# Patient Record
Sex: Male | Born: 1945 | Race: White | Hispanic: No | Marital: Married
Health system: Southern US, Community
[De-identification: ages and names within clinical notes are randomized; demographics above are authoritative.]

## PROBLEM LIST (undated history)

## (undated) ENCOUNTER — Ambulatory Visit: Admission: EM | Payer: No Typology Code available for payment source

## (undated) DIAGNOSIS — M1990 Unspecified osteoarthritis, unspecified site: Secondary | ICD-10-CM

## (undated) DIAGNOSIS — K219 Gastro-esophageal reflux disease without esophagitis: Secondary | ICD-10-CM

## (undated) DIAGNOSIS — K589 Irritable bowel syndrome without diarrhea: Secondary | ICD-10-CM

## (undated) DIAGNOSIS — J189 Pneumonia, unspecified organism: Secondary | ICD-10-CM

## (undated) DIAGNOSIS — Z91018 Allergy to other foods: Secondary | ICD-10-CM

## (undated) DIAGNOSIS — K635 Polyp of colon: Secondary | ICD-10-CM

## (undated) DIAGNOSIS — J40 Bronchitis, not specified as acute or chronic: Secondary | ICD-10-CM

## (undated) DIAGNOSIS — K449 Diaphragmatic hernia without obstruction or gangrene: Secondary | ICD-10-CM

## (undated) DIAGNOSIS — K5792 Diverticulitis of intestine, part unspecified, without perforation or abscess without bleeding: Secondary | ICD-10-CM

## (undated) DIAGNOSIS — M539 Dorsopathy, unspecified: Secondary | ICD-10-CM

## (undated) DIAGNOSIS — K5790 Diverticulosis of intestine, part unspecified, without perforation or abscess without bleeding: Secondary | ICD-10-CM

## (undated) DIAGNOSIS — K289 Gastrojejunal ulcer, unspecified as acute or chronic, without hemorrhage or perforation: Secondary | ICD-10-CM

## (undated) DIAGNOSIS — J349 Unspecified disorder of nose and nasal sinuses: Secondary | ICD-10-CM

## (undated) DIAGNOSIS — N200 Calculus of kidney: Secondary | ICD-10-CM

## (undated) DIAGNOSIS — K649 Unspecified hemorrhoids: Secondary | ICD-10-CM

## (undated) HISTORY — DX: Diaphragmatic hernia without obstruction or gangrene: K44.9

## (undated) HISTORY — DX: Dorsopathy, unspecified: M53.9

## (undated) HISTORY — DX: Calculus of kidney: N20.0

## (undated) HISTORY — DX: Diverticulitis of intestine, part unspecified, without perforation or abscess without bleeding: K57.92

## (undated) HISTORY — DX: Unspecified disorder of nose and nasal sinuses: J34.9

## (undated) HISTORY — DX: Bronchitis, not specified as acute or chronic: J40

## (undated) HISTORY — DX: Unspecified hemorrhoids: K64.9

## (undated) HISTORY — DX: Gastro-esophageal reflux disease without esophagitis: K21.9

## (undated) HISTORY — DX: Pneumonia, unspecified organism: J18.9

## (undated) HISTORY — DX: Unspecified osteoarthritis, unspecified site: M19.90

## (undated) HISTORY — DX: Irritable bowel syndrome, unspecified: K58.9

## (undated) HISTORY — DX: Gastrojejunal ulcer, unspecified as acute or chronic, without hemorrhage or perforation: K28.9

## (undated) HISTORY — PX: ESOPHAGEAL DILATION: SHX303

## (undated) HISTORY — DX: Diverticulosis of intestine, part unspecified, without perforation or abscess without bleeding: K57.90

## (undated) HISTORY — DX: Polyp of colon: K63.5

---

## 2003-11-12 DIAGNOSIS — K289 Gastrojejunal ulcer, unspecified as acute or chronic, without hemorrhage or perforation: Secondary | ICD-10-CM

## 2003-11-12 HISTORY — DX: Gastrojejunal ulcer, unspecified as acute or chronic, without hemorrhage or perforation: K28.9

## 2004-11-11 HISTORY — PX: HERNIA REPAIR: SHX51

## 2009-01-02 ENCOUNTER — Emergency Department: Payer: Self-pay | Admitting: Unknown Physician Specialty

## 2010-09-04 ENCOUNTER — Emergency Department: Payer: Self-pay | Admitting: Unknown Physician Specialty

## 2011-11-12 HISTORY — PX: HERNIA REPAIR: SHX51

## 2012-06-29 ENCOUNTER — Ambulatory Visit: Payer: Self-pay

## 2012-07-21 ENCOUNTER — Ambulatory Visit: Payer: Self-pay | Admitting: Ophthalmology

## 2012-07-21 DIAGNOSIS — I10 Essential (primary) hypertension: Secondary | ICD-10-CM

## 2012-08-03 ENCOUNTER — Ambulatory Visit: Payer: Self-pay | Admitting: Ophthalmology

## 2012-11-11 DIAGNOSIS — K635 Polyp of colon: Secondary | ICD-10-CM

## 2012-11-11 DIAGNOSIS — K5792 Diverticulitis of intestine, part unspecified, without perforation or abscess without bleeding: Secondary | ICD-10-CM

## 2012-11-11 HISTORY — PX: COLONOSCOPY: SHX174

## 2012-11-11 HISTORY — DX: Diverticulitis of intestine, part unspecified, without perforation or abscess without bleeding: K57.92

## 2012-11-11 HISTORY — DX: Polyp of colon: K63.5

## 2014-12-07 ENCOUNTER — Emergency Department: Payer: Self-pay | Admitting: Emergency Medicine

## 2014-12-20 ENCOUNTER — Encounter: Payer: Self-pay | Admitting: General Surgery

## 2014-12-20 ENCOUNTER — Ambulatory Visit (INDEPENDENT_AMBULATORY_CARE_PROVIDER_SITE_OTHER): Payer: Medicare Other | Admitting: General Surgery

## 2014-12-20 VITALS — BP 124/78 | HR 62 | Resp 14 | Ht 75.0 in | Wt 186.0 lb

## 2014-12-20 DIAGNOSIS — R1084 Generalized abdominal pain: Secondary | ICD-10-CM | POA: Diagnosis not present

## 2014-12-20 DIAGNOSIS — M549 Dorsalgia, unspecified: Secondary | ICD-10-CM

## 2014-12-20 NOTE — Progress Notes (Signed)
Patient ID: Matthew Newman, male   DOB: November 24, 1945, 69 y.o.   MRN: [id number]  Chief Complaint  Patient presents with  . Other    bilateral inguinal hernia    HPI Matthew Newman is a 69 y.o. male here today for evaluation of pain near the site of previous bilateral inguinal hernia repair.  He states he is having bilateral groin pain for 3 years since his [facility] [place] ranging from cortisone injection, dermal nerve ligation up to mesh removal. The pain is worse with activity and when bending over. The right groin does have more swelling than the left that will develop after periods of standing.  He does complain of night sweats with insomnia.   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  The patient brought with him CT scans from 2012 to the present.   He reports having extreme pain at all time, throughout the back and abdomen. This is not been associated with symptoms that suggest intestinal obstruction. Pain is aggravated by movement. When pressed, at times he found it difficult to express whether the pain began in the back or in the front of the abdomen. Pain can radiate into the lower extremities.     HPI  Past Medical History  Diagnosis Date  . Pneumonia   . Bronchitis   . GERD (gastroesophageal reflux disease)   . Colon polyp 2014  . Hiatal hernia   . Ulcer of the stomach and intestine 2005  . Hemorrhoid   . Arthritis   . Sinus problem   . Kidney stone   . IBS (irritable bowel syndrome)   . Diverticulosis   . Diverticulitis 2014  . Back problem     L4 L5    Past Surgical History  Procedure Laterality Date  . [facility]   . Esophageal dilation      No family history on file.  Social History History  Substance Use Topics  . Smoking status: Former Smoker    Quit date: 11/12/1983  . Smokeless  tobacco: Not on file  . Alcohol Use: No    Allergies  Allergen Reactions  . Aspirin   . Contrast Media [Iodinated Diagnostic Agents] Other (See Comments)    Pass out  . Ibuprofen   . Lactose Intolerance (Gi)   . Simvastatin     Crawling skin sensation  . Shellfish Allergy Rash    Current Outpatient Prescriptions  Medication Sig Dispense Refill  . FINASTERIDE PO Take by mouth daily.    . lansoprazole (PREVACID) 15 MG capsule Take 15 mg by mouth daily at 12 noon.     No current facility-administered medications for this visit.    Review of Systems Review of Systems  Constitutional: Negative.   Respiratory: Negative.   Cardiovascular: Negative.   Gastrointestinal: Positive for vomiting, abdominal pain and diarrhea.    Blood pressure 124/78, pulse 62, resp. rate 14, height  (1.905 m), weight 186 lb (84.369 kg).  Physical Exam Physical Exam  Constitutional: He is oriented to person, place, and time. He appears well-developed and well-nourished.  Cardiovascular: Normal rate, regular rhythm and normal heart sounds.   Pulmonary/Chest: Effort normal and breath sounds normal.  Abdominal: Tenderness: diffuse tenderness throughout the abdomen without focal findings.  Neurological: He is alert and oriented to person, place, and time.  Skin: Skin is warm and dry.    Data Reviewed CT scan of the abdomen and pelvis without contrast dated 08/30/2014 showed a 6 mm right middle lobe nodule stable since May 2013. Hypodense lesion in the liver identified. Meticulous is. Bilateral inguinal hernia mesh noted. Ovoid lesion surrounding the mesh in the right measures 2.5 x 4.0 cm and on the left measures 2.3 x 2.67 m, reported enlarged from past exams. Degenerative changes in the spine are noted.  MRI of the spine dated 01/08/2014 showed multilevel degenerative changes in the cervical spine and no significant interval progression of multilevel degenerative changes in the lumbar spine  with moderate foraminal stenosis at L4-5.  CT of the abdomen and pelvis dated 10/04/2013 reported fat within the right inguinal canal as well as in the left inguinal canal.  CT scan of 06/24/2013 just a prominent sigmoid diverticulosis as well as area of inflammatory inches suggestive diverticulitis.  Report from the 11/30/2014 CT scan was not available.  Review of the 2015 &2016 studies showed no interval change of the stable fluid collections around the mesh in both groins on my review.  Assessment    Severe back pain with radiation to the anterior abdominal wall. No clinical evidence of recurrent inguinal hernia formation.  Chronic seroma surrounding bilateral preperitoneal mesh without significant progression between 2015 and 2016. No evidence of inflammatory process adjacent to the seroma. No evidence of obstruction.    Plan    The patient exhibits extreme pain while moving from the chair to the examining table. It appears that all areas of his body or experiencing pain. Cannot suggest that the small seromas evident on CT are symptomatic. The patient is making use of no anti-inflammatories or analgesics, and I have encouraged him to consider evaluation by the pain clinic. He has been uncomfortable for quite a long time, and multimodality therapy will likely be necessary. I see no surgical indication for mesh removal at this time.  The patient reports he has records including his operative reports from the [facility]. I explained to him that I would be glad to review the operative reports that he brings them by the office.  The patient was initially evaluated by the nurse who was acting as Neurosurgeonscribe. The patient reported that he was a very private person and when I interviewed him privately, after explaining our "team care" approach asked him if there is anything he wanted to discuss outside of the nurses presents. He denied any additional information was pertinent.  At this time, referral to pain  management has been encouraged and the patient will notify the office if he desires to proceed.  The better part of 45 minutes was spent reviewing the patient's records and imaging studies, as well as examining the patient and reviewing the findings.     PCP:  None Ref Dr Bernadene Bellick Langley  Earline MayotteByrnett, Granite Godman W 12/24/2014, 12:55 PM

## 2014-12-24 DIAGNOSIS — M549 Dorsalgia, unspecified: Secondary | ICD-10-CM | POA: Insufficient documentation

## 2014-12-24 DIAGNOSIS — R1084 Generalized abdominal pain: Secondary | ICD-10-CM | POA: Insufficient documentation

## 2015-02-28 NOTE — Op Note (Signed)
PATIENT NAME:  Matthew Newman, Matthew Newman MR#:  [id number] DATE OF BIRTH:  [birth year]  DATE OF PROCEDURE:  08/03/2012  PREOPERATIVE DIAGNOSIS:  Senile cataract, left eye.  POSTOPERATIVE DIAGNOSIS:  Senile cataract, left eye.  PROCEDURE:  Phacoemulsification with posterior chamber intraocular lens implantation of the left  eye.  LENS:  ZCBOO 18.5-diopter posterior chamber intraocular lens.  ULTRASOUND TIME:   11% of  58 seconds.  CDE 6.5.   SURGEON:  Italyhad Treyson Axel, MD  ANESTHESIA:  Retrobulbar block of Xylocaine and Bupivacaine.  COMPLICATIONS:  None.  DESCRIPTION OF PROCEDURE:  The patient was identified in the holding room and transported to the operating room and placed in the supine position under the operating microscope.  The left eye was identified as the operative eye and a retrobulbar block was performed under intravenous sedation.  It was then prepped and draped in the usual sterile ophthalmic fashion.  A 1 millimeter clear-corneal paracentesis was made at the 12:00 position.  The anterior chamber was filled with Viscoat viscoelastic.  A 2.4 millimeter keratome was used to make a near-clear corneal incision at the 9:30 position.  A curvilinear capsulorrhexis was made with a cystotome and capsulorrhexis forceps.  Balanced salt solution was used to hydrodissect and hydrodelineate the nucleus.  Phacoemulsification was then used in horizontal chopping fashion to remove the lens nucleus and epinucleus.  The remaining cortex was then removed using the irrigation and aspiration handpiece.  Provisc was then placed into the capsular bag to distend it for lens placement.  A ZCBOO 18.5-diopter lens was then injected into the capsular bag.  The remaining viscoelastic was aspirated. Wounds were hydrated with balanced salt solution.  The anterior chamber was inflated to a physiologic pressure with balanced salt solution. Mio stat was placed into the anterior chamber to constrict the pupil.  No wound leaks  were noted.  Topical Vigamox drops and erythromycin ointment were applied to the eye.  The eye was patched.  The patient was taken to the recovery room in stable condition without complications of anesthesia or surgery.  ____________________________ Deirdre Evenerhadwick R. Georgiann Neider, MD crb:cbb D: 08/03/2012 12:36:47 ET Newman: 08/03/2012 13:01:25 ET JOB#: [id number]  cc: Deirdre Evenerhadwick R. Kamy Poinsett, MD, <Dictator> Lockie MolaHADWICK Jamese Trauger MD ELECTRONICALLY SIGNED 08/03/2012 13:21

## 2015-10-25 ENCOUNTER — Emergency Department
Admission: EM | Admit: 2015-10-25 | Discharge: 2015-10-25 | Disposition: A | Discharge status: Home / Self Care | Payer: Medicare Other | Attending: Emergency Medicine | Admitting: Emergency Medicine

## 2015-10-25 ENCOUNTER — Emergency Department: Payer: Medicare Other

## 2015-10-25 DIAGNOSIS — Z87891 Personal history of nicotine dependence: Secondary | ICD-10-CM | POA: Diagnosis not present

## 2015-10-25 DIAGNOSIS — R0602 Shortness of breath: Secondary | ICD-10-CM | POA: Insufficient documentation

## 2015-10-25 DIAGNOSIS — F329 Major depressive disorder, single episode, unspecified: Secondary | ICD-10-CM | POA: Insufficient documentation

## 2015-10-25 DIAGNOSIS — R079 Chest pain, unspecified: Secondary | ICD-10-CM | POA: Diagnosis not present

## 2015-10-25 DIAGNOSIS — Z79899 Other long term (current) drug therapy: Secondary | ICD-10-CM | POA: Insufficient documentation

## 2015-10-25 LAB — CBC
HEMATOCRIT: 44.2 % (ref 40.0–52.0)
Hemoglobin: 14.5 g/dL (ref 13.0–18.0)
MCH: 31 pg (ref 26.0–34.0)
MCHC: 32.9 g/dL (ref 32.0–36.0)
MCV: 94.2 fL (ref 80.0–100.0)
PLATELETS: 198 10*3/uL (ref 150–440)
RBC: 4.69 MIL/uL (ref 4.40–5.90)
RDW: 13 % (ref 11.5–14.5)
WBC: 5.1 10*3/uL (ref 3.8–10.6)

## 2015-10-25 LAB — BASIC METABOLIC PANEL
Anion gap: 6 (ref 5–15)
BUN: 9 mg/dL (ref 6–20)
CALCIUM: 9.2 mg/dL (ref 8.9–10.3)
CO2: 29 mmol/L (ref 22–32)
CREATININE: 0.91 mg/dL (ref 0.61–1.24)
Chloride: 104 mmol/L (ref 101–111)
GFR calc Af Amer: >60 mL/min (ref >60)
GFR calc non Af Amer: >60 mL/min (ref >60)
GLUCOSE: 148 mg/dL — AB (ref 65–99)
Potassium: 4.1 mmol/L (ref 3.5–5.1)
Sodium: 139 mmol/L (ref 135–145)

## 2015-10-25 LAB — TROPONIN I
Troponin I: <0.03 ng/mL (ref <0.031)
Troponin I: <0.03 ng/mL (ref <0.031)

## 2015-10-25 MED ORDER — ASPIRIN EC 325 MG PO TBEC
325.0000 mg | DELAYED_RELEASE_TABLET | Freq: Once | ORAL | Status: AC
  Administered 2015-10-25: 325 mg via ORAL
  Filled 2015-10-25: qty 1

## 2015-10-25 MED ORDER — NITROGLYCERIN 0.4 MG SL SUBL
0.4000 mg | SUBLINGUAL_TABLET | SUBLINGUAL | Status: DC | PRN
  Administered 2015-10-25: 0.4 mg via SUBLINGUAL
  Filled 2015-10-25 (×2): qty 1

## 2015-10-25 MED ORDER — GI COCKTAIL ~~LOC~~
30.0000 mL | Freq: Once | ORAL | Status: AC
  Administered 2015-10-25: 30 mL via ORAL
  Filled 2015-10-25: qty 30

## 2015-10-25 NOTE — Discharge Instructions (Signed)
Please make follow-up appointments with your cardiologist and your GI doctor this week. Please bring your records from your emergency department visit here when you go for your follow-up.  Return to the emergency department if he developed chest pain, shortness of breath, palpitations, lightheadedness or fainting, fever, or any other symptoms concerning to you.

## 2015-10-25 NOTE — ED Provider Notes (Signed)
[facility] Emergency Department Provider Note  ____________________________________________  Time seen: Approximately 10:04 AM  I have reviewed the triage vital signs and the nursing notes.   HISTORY  Chief Complaint Chest Pain    HPI Matthew Newman is a 69 y.o. male with a history of hiatal hernia, PUD, GERD, eosinophilic esophagitis presenting with chest pain. Patient reports that this morning he was standing and he developed a central chest tightness associated with a "dull" left-sided chest pain without radiation. Mild associated shortness of breath without diaphoresis, nausea or vomiting, or palpitations. No lightheadedness or syncope. He laid down which did not improve the symptoms, and when he stood up to fix his breakfast the symptoms worsens. At this time, the symptoms have improved without any intervention but he still has a dull ache. No lower extremity swelling, recent illness, cough or cold symptoms.  Pt reports neg stress test at [facility] in 6/16; no hx of cardiac cath.  SH: denies smoking or cocaine   Past Medical History  Diagnosis Date  . Pneumonia   . Bronchitis   . GERD (gastroesophageal reflux disease)   . Colon polyp 2014  . Hiatal hernia   . Ulcer of the stomach and intestine 2005  . Hemorrhoid   . Arthritis   . Sinus problem   . Kidney stone   . IBS (irritable bowel syndrome)   . Diverticulosis   . Diverticulitis 2014  . Back problem     L4 L5    Patient Active Problem List   Diagnosis Date Noted  . Notalgia 12/24/2014  . Generalized abdominal pain 12/24/2014    Past Surgical History  Procedure Laterality Date  [facility]   . Es[facility]      Current Outpatient Rx  Name  Route  Sig  Dispense  Refill  . FINASTERIDE PO   Oral   Take by mouth daily.         . lansoprazole (PREVACID) 15 MG capsule   Oral   Take 15 mg by  mouth daily at 12 noon.           Allergies Aspirin; Contrast media; Ibuprofen; Lactose intolerance (gi); Simvastatin; and Shellfish allergy  No family history on file.  Social History Social History  Substance Use Topics  . Smoking status: Former Smoker    Quit date: 11/12/1983  . Smokeless tobacco: None  . Alcohol Use: No    Review of Systems Constitutional: No fever/chills Eyes: No visual changes. ENT: No sore throat. Cardiovascular: Positive chest pain, negative palpitations. Respiratory: Positive shortness of breath.  No cough. Gastrointestinal: No abdominal pain.  No nausea, no vomiting.  No diarrhea.  No constipation. Genitourinary: Negative for dysuria. Musculoskeletal: Negative for back pain. Skin: Negative for rash. Neurological: Negative for headaches, focal weakness or numbness.  10-point ROS otherwise negative.  ____________________________________________   PHYSICAL EXAM:  VITAL SIGNS: ED Triage Vitals  Enc Vitals Group     BP 10/25/15 0913 153/82 mmHg     Pulse Rate 10/25/15 0913 86     Resp 10/25/15 0913 18     Temp 10/25/15 0913 98 F (36.7 C)     Temp Source 10/25/15 0913 Oral     SpO2 10/25/15 0913 100 %     Weight 10/25/15 0913 183 lb (83.008 kg)     Height  10/25/15 0913  (1.88 m)     Head Cir --      Peak Flow --      Pain Score 10/25/15 0913 2     Pain Loc --      Pain Edu? --      Excl. in GC? --     Constitutional: Alert and oriented. Well appearing and in no acute distress. Answer question appropriately. Eyes: Conjunctivae are normal.  EOMI. Head: Atraumatic. Nose: No congestion/rhinnorhea. Mouth/Throat: Mucous membranes are moist.  Neck: No stridor.  Supple.  No JVD Cardiovascular: Normal rate, regular rhythm. No murmurs, rubs or gallops.  Respiratory: Normal respiratory effort.  No retractions. Lungs CTAB.  No wheezes, rales or ronchi. Gastrointestinal: Soft and nontender. No distention. No peritoneal  signs. Musculoskeletal: No LE edema. No palpable cords, Homan's sign or ttp in the calves. Neurologic:  Normal speech and language. No gross focal neurologic deficits are appreciated.  Skin:  Skin is warm, dry and intact. No rash noted. Psychiatric: Depressed mood and flat affect. Speech and behavior are normal.  Normal judgement.  ____________________________________________   LABS (all labs ordered are listed, but only abnormal results are displayed)  Labs Reviewed  BASIC METABOLIC PANEL - Abnormal; Notable for the following:    Glucose, Bld 148 (*)    All other components within normal limits  CBC  TROPONIN I  TROPONIN I   ____________________________________________  EKG  ED ECG REPORT I, Rockne Menghini, the attending physician, personally viewed and interpreted this ECG.   Date: 10/25/2015  EKG Time: 910  Rate: 75  Rhythm: normal sinus rhythm  Axis: Leftward  Intervals:none  ST&T Change: Nonspecific T-wave inversion in V1. No ST elevation.  ____________________________________________  RADIOLOGY  No results found.  ____________________________________________   PROCEDURES  Procedure(s) performed: None  Critical Care performed: No ____________________________________________   INITIAL IMPRESSION / ASSESSMENT AND PLAN / ED COURSE  Pertinent labs & imaging results that were available during my care of the patient were reviewed by me and considered in my medical decision making (see chart for details).  69 y.o. male with no known CAD but significant GI history presenting with chest pain. The patient has not recently been having any other chest pain but did have an acute onset of a substernal chest pain today. His EKG is reassuring and I will send a troponin and repeat one every 4 hours as the onset of symptoms was at 8:30 AM. He has a negative stress test within the past 6 months, so if his workup in the emergency department is negative, we'll plan to  discharge him home for outpatient evaluation.  He has an outpatient cardiologist with whom he already spoke today.  GI etiology is a likely cause of his pain as well.  ----------------------------------------- 10:14 AM on 10/25/2015 -----------------------------------------  Patient is refusing chest x-ray "because my EKG was fine."  ----------------------------------------- 11:39 AM on 10/25/2015 -----------------------------------------  The patient had no change in his dull chest pain with nitroglycerin glycerin. I will try a GI cocktail and reevaluate him.  ----------------------------------------- 2:19 PM on 10/25/2015 -----------------------------------------  ED ECG REPORT I, Rockne Menghini, the attending physician, personally viewed and interpreted this ECG.   Date: 10/25/2015  EKG Time: 1421  Rate: 58  Rhythm: sinus bradycardia  Axis: Afterward  Intervals:none  ST&T Change: Nonspecific T-wave inversions in V1. No ST elevation. Case is grossly unchanged from the first EKG that was performed today.   The patient states that at this time he  is symptom-free. He reports that when the nurse came to draw his second troponin, that he had a repeat episode of chest pain that completely resolved with a GI cocktail. At this time, the patient has to reassuring EKGs, 2 troponins that are negative, and otherwise negative workup in the emergency department. He will follow up with both his cardiologist and his GI doctor at the [facility]. He understands return precautions as well as follow-up instructions. ____________________________________________  FINAL CLINICAL IMPRESSION(S) / ED DIAGNOSES  Final diagnoses:  Chest pain, unspecified chest pain type  Shortness of breath      NEW MEDICATIONS STARTED DURING THIS VISIT:  New Prescriptions   No medications on file     Rockne MenghiniAnne-Caroline Theus Espin, MD 10/25/15 1426

## 2015-10-25 NOTE — ED Notes (Signed)
Pt discharged home after verbalizing understanding of discharge instructions; nad noted. 

## 2015-10-25 NOTE — ED Notes (Signed)
Pt c/o chest pain that started 30min PTA.Marland Kitchen. Denies SOB/N/V

## 2015-10-25 NOTE — ED Notes (Signed)
Pt states that the nirto tablet did not provide any noticeable difference. Still describes the pain as dull and a level 1.

## 2018-03-06 ENCOUNTER — Other Ambulatory Visit: Payer: Self-pay

## 2018-03-06 ENCOUNTER — Emergency Department: Payer: No Typology Code available for payment source

## 2018-03-06 ENCOUNTER — Emergency Department
Admission: EM | Admit: 2018-03-06 | Discharge: 2018-03-06 | Disposition: A | Discharge status: Home / Self Care | Payer: No Typology Code available for payment source | Attending: Emergency Medicine | Admitting: Emergency Medicine

## 2018-03-06 DIAGNOSIS — M25512 Pain in left shoulder: Secondary | ICD-10-CM | POA: Insufficient documentation

## 2018-03-06 DIAGNOSIS — Y92410 Unspecified street and highway as the place of occurrence of the external cause: Secondary | ICD-10-CM | POA: Diagnosis not present

## 2018-03-06 DIAGNOSIS — Y9389 Activity, other specified: Secondary | ICD-10-CM | POA: Diagnosis not present

## 2018-03-06 DIAGNOSIS — R109 Unspecified abdominal pain: Secondary | ICD-10-CM | POA: Diagnosis not present

## 2018-03-06 DIAGNOSIS — M25561 Pain in right knee: Secondary | ICD-10-CM | POA: Insufficient documentation

## 2018-03-06 DIAGNOSIS — Z87891 Personal history of nicotine dependence: Secondary | ICD-10-CM | POA: Diagnosis not present

## 2018-03-06 DIAGNOSIS — Y998 Other external cause status: Secondary | ICD-10-CM | POA: Diagnosis not present

## 2018-03-06 HISTORY — DX: Allergy to other foods: Z91.018

## 2018-03-06 LAB — TROPONIN I: Troponin I: <0.03 ng/mL (ref <0.03)

## 2018-03-06 MED ORDER — ONDANSETRON HCL 4 MG/2ML IJ SOLN: 4.0000 mg | Freq: Once | INTRAMUSCULAR | Status: DC

## 2018-03-06 MED ORDER — MORPHINE SULFATE (PF) 2 MG/ML IV SOLN: 2.0000 mg | Freq: Once | INTRAVENOUS | Status: DC

## 2018-03-06 NOTE — Discharge Instructions (Addendum)
Please follow-up with your regular doctor later this week. Use Tylenol or Advil as needed for pain. There are no fractures that I can see. If the pain continues for more than the next day or 2 please follow-up with orthopedics. Dr. Hyacinth MeekerMiller is on call. it can give the office a call today and get an appointment for early next week and then if need be can cancel it.Please return to the ER for new or different pain  or if anything different turns up.

## 2018-03-06 NOTE — ED Notes (Signed)
Pt still in imaging

## 2018-03-06 NOTE — ED Notes (Signed)
ED Provider at bedside. 

## 2018-03-06 NOTE — ED Notes (Signed)
Pt has $300 cash on him, pt put is in his pocked to avoid it being left in room when he goes to have xray.
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
## 2019-08-25 ENCOUNTER — Other Ambulatory Visit: Payer: Self-pay

## 2019-08-25 DIAGNOSIS — Z20822 Contact with and (suspected) exposure to covid-19: Secondary | ICD-10-CM

## 2019-08-26 LAB — NOVEL CORONAVIRUS, NAA: SARS-CoV-2, NAA: NOT DETECTED

## 2019-08-27 ENCOUNTER — Telehealth: Payer: Self-pay | Admitting: General Practice

## 2019-08-27 NOTE — Telephone Encounter (Signed)
Negative COVID results given. Patient results "NOT Detected." Caller expressed understanding. ° °

## 2019-10-06 ENCOUNTER — Other Ambulatory Visit: Payer: Self-pay

## 2019-10-06 DIAGNOSIS — Z20822 Contact with and (suspected) exposure to covid-19: Secondary | ICD-10-CM

## 2019-10-07 LAB — NOVEL CORONAVIRUS, NAA: SARS-CoV-2, NAA: NOT DETECTED

## 2019-10-08 ENCOUNTER — Telehealth: Payer: Self-pay | Admitting: *Deleted
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
## 2019-10-08 NOTE — Telephone Encounter (Signed)
Patient called and was given negative covid results . 

## 2019-10-12 ENCOUNTER — Other Ambulatory Visit: Payer: Self-pay | Admitting: *Deleted

## 2019-10-12 DIAGNOSIS — Z20822 Contact with and (suspected) exposure to covid-19: Secondary | ICD-10-CM

## 2019-10-15 LAB — NOVEL CORONAVIRUS, NAA: SARS-CoV-2, NAA: NOT DETECTED

## 2019-10-18 ENCOUNTER — Telehealth: Payer: Self-pay | Admitting: General Practice

## 2019-10-18 NOTE — Telephone Encounter (Signed)
Negative COVID results given. Patient results "NOT Detected." Caller expressed understanding. ° °

## 2020-06-16 ENCOUNTER — Other Ambulatory Visit: Payer: Self-pay

## 2020-06-16 ENCOUNTER — Emergency Department
Admission: EM | Admit: 2020-06-16 | Discharge: 2020-06-16 | Discharge status: Against Medical Advice | Payer: Medicare Other

## 2022-05-18 ENCOUNTER — Emergency Department
Admission: EM | Admit: 2022-05-18 | Discharge: 2022-05-18 | Discharge status: Against Medical Advice | Payer: Medicare Other | Attending: Emergency Medicine | Admitting: Emergency Medicine

## 2022-05-18 ENCOUNTER — Other Ambulatory Visit: Payer: Self-pay

## 2022-05-18 DIAGNOSIS — Z5321 Procedure and treatment not carried out due to patient leaving prior to being seen by health care provider: Secondary | ICD-10-CM | POA: Insufficient documentation

## 2022-05-18 DIAGNOSIS — R55 Syncope and collapse: Secondary | ICD-10-CM | POA: Insufficient documentation

## 2022-05-18 DIAGNOSIS — R42 Dizziness and giddiness: Secondary | ICD-10-CM | POA: Diagnosis present

## 2022-05-18 LAB — BASIC METABOLIC PANEL
Anion gap: 9 (ref 5–15)
BUN: 16 mg/dL (ref 8–23)
CO2: 26 mmol/L (ref 22–32)
Calcium: 9.3 mg/dL (ref 8.9–10.3)
Chloride: 105 mmol/L (ref 98–111)
Creatinine, Ser: 0.95 mg/dL (ref 0.61–1.24)
GFR, Estimated: >60 mL/min (ref >60)
Glucose, Bld: 130 mg/dL — ABNORMAL HIGH (ref 70–99)
Potassium: 4.1 mmol/L (ref 3.5–5.1)
Sodium: 140 mmol/L (ref 135–145)

## 2022-05-18 LAB — CBC
HCT: 39.8 % (ref 39.0–52.0)
Hemoglobin: 13.2 g/dL (ref 13.0–17.0)
MCH: 31.4 pg (ref 26.0–34.0)
MCHC: 33.2 g/dL (ref 30.0–36.0)
MCV: 94.5 fL (ref 80.0–100.0)
Platelets: 155 10*3/uL (ref 150–400)
RBC: 4.21 MIL/uL — ABNORMAL LOW (ref 4.22–5.81)
RDW: 12.5 % (ref 11.5–15.5)
WBC: 3.8 10*3/uL — ABNORMAL LOW (ref 4.0–10.5)
nRBC: 0 % (ref 0.0–0.2)
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
## 2022-05-18 NOTE — ED Notes (Signed)
Patient at first nurse desk stating that he was able to contact his doctor, and is leaving to be seen in [place].

## 2023-07-22 ENCOUNTER — Other Ambulatory Visit: Payer: Self-pay

## 2023-07-22 ENCOUNTER — Emergency Department
Admission: EM | Admit: 2023-07-22 | Discharge: 2023-07-22 | Disposition: A | Discharge status: Home / Self Care | Payer: No Typology Code available for payment source | Attending: Emergency Medicine | Admitting: Emergency Medicine

## 2023-07-22 ENCOUNTER — Encounter: Payer: Self-pay | Admitting: Emergency Medicine

## 2023-07-22 ENCOUNTER — Emergency Department: Payer: No Typology Code available for payment source

## 2023-07-22 DIAGNOSIS — R079 Chest pain, unspecified: Secondary | ICD-10-CM | POA: Diagnosis not present

## 2023-07-22 DIAGNOSIS — R1013 Epigastric pain: Secondary | ICD-10-CM | POA: Diagnosis not present

## 2023-07-22 DIAGNOSIS — R109 Unspecified abdominal pain: Secondary | ICD-10-CM | POA: Diagnosis present

## 2023-07-22 LAB — BASIC METABOLIC PANEL
Anion gap: 10 (ref 5–15)
BUN: 9 mg/dL (ref 8–23)
CO2: 26 mmol/L (ref 22–32)
Calcium: 9.4 mg/dL (ref 8.9–10.3)
Chloride: 100 mmol/L (ref 98–111)
Creatinine, Ser: 0.87 mg/dL (ref 0.61–1.24)
GFR, Estimated: >60 mL/min (ref >60)
Glucose, Bld: 114 mg/dL — ABNORMAL HIGH (ref 70–99)
Potassium: 3.9 mmol/L (ref 3.5–5.1)
Sodium: 136 mmol/L (ref 135–145)

## 2023-07-22 LAB — CBC
HCT: 39.7 % (ref 39.0–52.0)
Hemoglobin: 13.4 g/dL (ref 13.0–17.0)
MCH: 31.5 pg (ref 26.0–34.0)
MCHC: 33.8 g/dL (ref 30.0–36.0)
MCV: 93.4 fL (ref 80.0–100.0)
Platelets: 190 10*3/uL (ref 150–400)
RBC: 4.25 MIL/uL (ref 4.22–5.81)
RDW: 12.5 % (ref 11.5–15.5)
WBC: 5.3 10*3/uL (ref 4.0–10.5)
nRBC: 0 % (ref 0.0–0.2)

## 2023-07-22 LAB — TROPONIN I (HIGH SENSITIVITY): Troponin I (High Sensitivity): 5 ng/L (ref <18)

## 2023-07-22 MED ORDER — FAMOTIDINE 20 MG PO TABS
20.0000 mg | ORAL_TABLET | Freq: Once | ORAL | Status: AC
  Administered 2023-07-22: 20 mg via ORAL
  Filled 2023-07-22: qty 1

## 2023-07-22 MED ORDER — OMEPRAZOLE MAGNESIUM 20 MG PO TBEC
20.0000 mg | DELAYED_RELEASE_TABLET | Freq: Every day | ORAL | 0 refills | Status: DC
Start: 2023-07-22 — End: 2023-12-12

## 2023-07-22 NOTE — ED Notes (Signed)
See triage note  Presents with chest /abd pain for about 3 weeks of more  States he has been seen at the [facility] for the same Denies any n/v/d or fever  States he has had some issues with constipation

## 2023-07-22 NOTE — Discharge Instructions (Addendum)
You were seen in the emergency department for abdominal pain and chest pain.  You had a CT scan done without contrast that did not show any abnormalities to explain your abdominal pain.  Your heart enzymes were normal and do not believe you are having a heart attack today.  It is importantly call your primary care physician so you continue to have workup for your abdominal pain.  You were started on an acid reducing medication.  Take this daily for the next 1 month.  You may need to follow-up with GI given your ongoing symptoms, you may need a repeat scope.

## 2023-07-22 NOTE — ED Triage Notes (Signed)
Pt sts that he has been having chest pain for the last three weeks. Pt sts that he came in due to the pain has gotten worse.

## 2023-07-22 NOTE — ED Provider Notes (Signed)
[facility] Provider Note    Event Date/Time   First MD Initiated Contact with Patient 07/22/23 1830     (approximate)   History   Chest Pain   HPI  Matthew Newman is a 77 y.o. male past medical history significant for chronic back pain, who presents to the emergency department with chest and abdominal pain.  States the symptoms have been ongoing for multiple weeks.  States that he has had CT scans and MRIs but these were all done at the [place].  Ongoing pain today so he was told to come to the emergency department.  Denies any active chest pain at this time.  States that his pain is worse with eating and with change of diet.  Denies any significant constipation at this time.  Denies blood in his stool.  Prior colonoscopy and endoscopy.  No history of DVT or PE.  Feels like there is a burning/sensation to the upper part of his stomach and his lower left side.  No prior stress testing or cardiac catheterization.     Physical Exam   Triage Vital Signs: ED Triage Vitals  Encounter Vitals Group     BP 07/22/23 1544 113/80     Systolic BP Percentile --      Diastolic BP Percentile --      Pulse Rate 07/22/23 1544 71     Resp 07/22/23 1552 19     Temp 07/22/23 1544 98.5 F (36.9 C)     Temp Source 07/22/23 1544 Oral     SpO2 07/22/23 1544 98 %     Weight 07/22/23 1552 150 lb (68 kg)     Height 07/22/23 1552 6\' 3"  (1.905 m)     Head Circumference --      Peak Flow --      Pain Score 07/22/23 1552 6     Pain Loc --      Pain Education --      Exclude from Growth Chart --     Most recent vital signs: Vitals:   07/22/23 1544 07/22/23 1552  BP: 113/80   Pulse: 71   Resp:  19  Temp: 98.5 F (36.9 C)   SpO2: 98%     Physical Exam Constitutional:      Appearance: He is well-developed.  HENT:     Head: Atraumatic.  Eyes:     Conjunctiva/sclera: Conjunctivae normal.  Cardiovascular:     Rate and Rhythm: Regular rhythm.     Heart sounds: Normal  heart sounds. No murmur heard. Pulmonary:     Effort: No respiratory distress.  Musculoskeletal:     Cervical back: Normal range of motion.  Skin:    General: Skin is warm.  Neurological:     Mental Status: He is alert. Mental status is at baseline.     IMPRESSION / MDM / ASSESSMENT AND PLAN / ED COURSE  I reviewed the triage vital signs and the nursing notes.  Differential diagnosis including GERD, pancreatitis, bowel obstruction, ACS, symptomatic cholelithiasis, malignancy  EKG  I, Corena Herter, the attending physician, personally viewed and interpreted this ECG.   Rate: Normal  Rhythm: Normal sinus  Axis: Left  Intervals: Normal  ST&T Change: None  No tachycardic or bradycardic dysrhythmias while on cardiac telemetry.  RADIOLOGY I independently reviewed imaging, my interpretation of imaging: Chest x-ray with no signs of pneumonia.  CT scan of the abdomen and pelvis with no acute findings.  LABS (all labs ordered are  listed, but only abnormal results are displayed) Labs interpreted as -    Labs Reviewed  BASIC METABOLIC PANEL - Abnormal; Notable for the following components:      Result Value   Glucose, Bld 114 (*)    All other components within normal limits  CBC  TROPONIN I (HIGH SENSITIVITY)     MDM     Troponin negative.  Lab work reassuring.  No right upper quadrant abdominal tenderness to palpation have a low suspicion for symptomatic cholelithiasis or acute cholecystitis.  No signs of a bowel obstruction.  Possible acid reflux or IBS.  Discussed possible need for colonoscopy with gastroenterology.  Discussed close follow-up with primary care physician.  Have a low suspicion for ACS given the ongoing nature of his symptoms and they appear to be more abdominal than chest pain related.  Given return precautions for any worsening symptoms.  Patient started on a PPI.  PROCEDURES:  Critical Care performed: No  Procedures  Patient's presentation is most  consistent with acute presentation with potential threat to life or bodily function.   MEDICATIONS ORDERED IN ED: Medications  famotidine (PEPCID) tablet 20 mg (20 mg Oral Given 07/22/23 2136)    FINAL CLINICAL IMPRESSION(S) / ED DIAGNOSES   Final diagnoses:  Epigastric pain     Rx / DC Orders   ED Discharge Orders          Ordered    omeprazole (PRILOSEC OTC) 20 MG tablet  Daily        07/22/23 2053             Note:  This document was prepared using Dragon voice recognition software and may include unintentional dictation errors.   Corena Herter, MD 07/23/23 0000

## 2023-08-05 ENCOUNTER — Other Ambulatory Visit: Payer: Self-pay

## 2023-08-05 ENCOUNTER — Emergency Department
Admission: EM | Admit: 2023-08-05 | Discharge: 2023-08-06 | Disposition: A | Discharge status: Home / Self Care | Payer: Medicare Other | Attending: Emergency Medicine | Admitting: Emergency Medicine

## 2023-08-05 DIAGNOSIS — R1030 Lower abdominal pain, unspecified: Secondary | ICD-10-CM | POA: Insufficient documentation

## 2023-08-05 DIAGNOSIS — Z87442 Personal history of urinary calculi: Secondary | ICD-10-CM | POA: Diagnosis not present

## 2023-08-05 LAB — COMPREHENSIVE METABOLIC PANEL
ALT: 13 U/L (ref 0–44)
AST: 24 U/L (ref 15–41)
Albumin: 4.1 g/dL (ref 3.5–5.0)
Alkaline Phosphatase: 38 U/L (ref 38–126)
Anion gap: 9 (ref 5–15)
BUN: 11 mg/dL (ref 8–23)
CO2: 23 mmol/L (ref 22–32)
Calcium: 9 mg/dL (ref 8.9–10.3)
Chloride: 102 mmol/L (ref 98–111)
Creatinine, Ser: 0.93 mg/dL (ref 0.61–1.24)
GFR, Estimated: >60 mL/min (ref >60)
Glucose, Bld: 102 mg/dL — ABNORMAL HIGH (ref 70–99)
Potassium: 3.8 mmol/L (ref 3.5–5.1)
Sodium: 134 mmol/L — ABNORMAL LOW (ref 135–145)
Total Bilirubin: 1.1 mg/dL (ref 0.3–1.2)
Total Protein: 6.7 g/dL (ref 6.5–8.1)

## 2023-08-05 LAB — CBC
HCT: 38.5 % — ABNORMAL LOW (ref 39.0–52.0)
Hemoglobin: 13.1 g/dL (ref 13.0–17.0)
MCH: 32.2 pg (ref 26.0–34.0)
MCHC: 34 g/dL (ref 30.0–36.0)
MCV: 94.6 fL (ref 80.0–100.0)
Platelets: 169 10*3/uL (ref 150–400)
RBC: 4.07 MIL/uL — ABNORMAL LOW (ref 4.22–5.81)
RDW: 12.6 % (ref 11.5–15.5)
WBC: 4.7 10*3/uL (ref 4.0–10.5)
nRBC: 0 % (ref 0.0–0.2)

## 2023-08-05 LAB — URINALYSIS, ROUTINE W REFLEX MICROSCOPIC
Bilirubin Urine: NEGATIVE
Glucose, UA: NEGATIVE mg/dL
Hgb urine dipstick: NEGATIVE
Ketones, ur: 5 mg/dL — AB
Leukocytes,Ua: NEGATIVE
Nitrite: NEGATIVE
Protein, ur: NEGATIVE mg/dL
Specific Gravity, Urine: 1.009 (ref 1.005–1.030)
pH: 6 (ref 5.0–8.0)

## 2023-08-05 LAB — LIPASE, BLOOD: Lipase: 28 U/L (ref 11–51)

## 2023-08-05 NOTE — ED Triage Notes (Signed)
Acute onset lower ad pain 30 min pta. Denies n/v/d. Reports 2 complete bm today. Pt ambulatory to triage with cane. Breathing unlabored speaking in full sentences. Pt alert and oriented.

## 2023-08-06 ENCOUNTER — Emergency Department: Payer: Medicare Other

## 2023-08-06 DIAGNOSIS — R1030 Lower abdominal pain, unspecified: Secondary | ICD-10-CM | POA: Diagnosis not present

## 2023-08-06 MED ORDER — POLYETHYLENE GLYCOL 3350 17 G PO PACK: 17.0000 g | PACK | Freq: Every day | ORAL | 0 refills | Status: DC

## 2023-08-06 NOTE — Discharge Instructions (Addendum)
Your testing in the emergency department did not show any emergency conditions that can account for your abdominal pain that would require hospitalization or surgery at this time.    Was a significant amount of stool, you may be constipated which can cause pains, take MiraLAX as prescribed.  Take acetaminophen 650 mg and ibuprofen 400 mg every 6 hours for pain.  Take with food.  [facility] to [facility] appointment and discuss meeting with a gastroenterologist given your recurrent abdominal pain.   Thank you for choosing [place] for your health care today!  Please see your primary doctor this week for a follow up appointment.   If you have any new, worsening, or unexpected symptoms call your doctor right away or come back to the emergency department for reevaluation.  It was my pleasure to care for you today.   Daneil Dan [place] Charon, MD

## 2023-08-06 NOTE — ED Provider Notes (Signed)
[facility] Provider Note    Event Date/Time   First MD Initiated Contact with Patient 08/05/23 2359     (approximate)   History   Abdominal Pain   HPI  Matthew Newman is a 77 y.o. male   Past medical history of kidney stones, prior hernia repair, patient reported appendectomy, presents emerged part with acute onset lower abdominal pain tonight.  Happened while at rest.  Made 2 normal bowel movements today, was in regular state of health, pain feels a stabbing pain.  No urinary changes.  No diarrhea or GI bleeding.  No nausea or vomiting.  No masses noted.  No testicular pain.   External Medical Documents Reviewed: CT scan obtained 07/22/2023 for abdominal pain found to have sigmoid diverticulosis but no other acute intra-abdominal pathologies.  That visit on 910 he presented with chest and abdominal pain with largely unremarkable workup ultimately discharged.      Physical Exam   Triage Vital Signs: ED Triage Vitals  Encounter Vitals Group     BP 08/05/23 2206 119/75     Systolic BP Percentile --      Diastolic BP Percentile --      Pulse Rate 08/05/23 2206 (!) 59     Resp 08/05/23 2206 18     Temp 08/05/23 2206 98.2 F (36.8 C)     Temp Source 08/05/23 2206 Oral     SpO2 08/05/23 2206 96 %     Weight 08/05/23 2207 147 lb (66.7 kg)     Height 08/05/23 2207 6\' 3"  (1.905 m)     Head Circumference --      Peak Flow --      Pain Score 08/05/23 2207 10     Pain Loc --      Pain Education --      Exclude from Growth Chart --     Most recent vital signs: Vitals:   08/05/23 2206 08/06/23 0114  BP: 119/75 111/70  Pulse: (!) 59 (!) 57  Resp: 18 18  Temp: 98.2 F (36.8 C) 98.2 F (36.8 C)  SpO2: 96% 97%    General: Awake, no distress.  CV:  Good peripheral perfusion.  Resp:  Normal effort.  Abd:  No distention.  Other:  Awake alert comfortable appearing pleasant gentleman no acute distress with normal vital signs.  He has some mild  tenderness to palpation in the lower quadrants bilaterally and below the umbilicus with no obvious masses, inguinal area w No masses   ED Results / Procedures / Treatments   Labs (all labs ordered are listed, but only abnormal results are displayed) Labs Reviewed  COMPREHENSIVE METABOLIC PANEL - Abnormal; Notable for the following components:      Result Value   Sodium 134 (*)    Glucose, Bld 102 (*)    All other components within normal limits  CBC - Abnormal; Notable for the following components:   RBC 4.07 (*)    HCT 38.5 (*)    All other components within normal limits  URINALYSIS, ROUTINE W REFLEX MICROSCOPIC - Abnormal; Notable for the following components:   Color, Urine YELLOW (*)    APPearance CLEAR (*)    Ketones, ur 5 (*)    All other components within normal limits  LIPASE, BLOOD     I ordered and reviewed the above labs they are notable for white blood cell count, H&H, urine appears normal    RADIOLOGY I independently reviewed and interpreted CT  of the abdomen pelvis see no obvious inflammatory obstructive changes. I also reviewed radiologist's formal read.   PROCEDURES:  Critical Care performed: No  Procedures   MEDICATIONS ORDERED IN ED: Medications - No data to display   IMPRESSION / MDM / ASSESSMENT AND PLAN / ED COURSE  I reviewed the triage vital signs and the nursing notes.                                Patient's presentation is most consistent with acute presentation with potential threat to life or bodily function.  Differential diagnosis includes, but is not limited to, intra-abdominal infection or obstruction, hernia, incarcerated or strangulated hernia, urine infection, kidney stone, considered but less likely mesenteric ischemia or aortic dissection or rupture   The patient is on the cardiac monitor to evaluate for evidence of arrhythmia and/or significant heart rate changes.  MDM:    Patient with acute onset lower abdominal  pain.  Seen for the same 2 weeks ago in the emergency department with unremarkable workup.  Mild tenderness to palpation will obtain repeat CT scan today.  Check labs within normal limits urinalysis looks normal as well.  Doubt mesenteric ischemia given no pain out of proportion, doubt aortic dissection or rupture.   Ultimately if CT shows no emergent findings we will follow-up with the [facility] doctor as advised on last visit to the emergency department.   Fortunately  CT scan without any emergent findings, moderate stool burden, will give MiraLAX prescription and have him follow-up with his primary doctor and discharge.      FINAL CLINICAL IMPRESSION(S) / ED DIAGNOSES   Final diagnoses:  Lower abdominal pain     Rx / DC Orders   ED Discharge Orders          Ordered    polyethylene glycol (MIRALAX) 17 g packet  Daily        08/06/23 0112             Note:  This document was prepared using Dragon voice recognition software and may include unintentional dictation errors.    Pilar Jarvis, MD 08/06/23 [phone]

## 2023-08-06 NOTE — ED Notes (Signed)
Pt back from CT

## 2023-08-06 NOTE — ED Notes (Signed)
Pt in the restroom attempting to have BM, Call bell within reach.

## 2023-12-12 ENCOUNTER — Emergency Department
Admission: EM | Admit: 2023-12-12 | Discharge: 2023-12-12 | Disposition: A | Discharge status: Home / Self Care | Payer: No Typology Code available for payment source | Attending: Emergency Medicine | Admitting: Emergency Medicine

## 2023-12-12 ENCOUNTER — Other Ambulatory Visit: Payer: Self-pay

## 2023-12-12 ENCOUNTER — Emergency Department: Payer: No Typology Code available for payment source

## 2023-12-12 DIAGNOSIS — R0789 Other chest pain: Secondary | ICD-10-CM | POA: Diagnosis present

## 2023-12-12 DIAGNOSIS — R911 Solitary pulmonary nodule: Secondary | ICD-10-CM

## 2023-12-12 LAB — CBC
HCT: 39.8 % (ref 39.0–52.0)
Hemoglobin: 13.3 g/dL (ref 13.0–17.0)
MCH: 32.4 pg (ref 26.0–34.0)
MCHC: 33.4 g/dL (ref 30.0–36.0)
MCV: 97.1 fL (ref 80.0–100.0)
Platelets: 152 10*3/uL (ref 150–400)
RBC: 4.1 MIL/uL — ABNORMAL LOW (ref 4.22–5.81)
RDW: 12.5 % (ref 11.5–15.5)
WBC: 3.5 10*3/uL — ABNORMAL LOW (ref 4.0–10.5)
nRBC: 0 % (ref 0.0–0.2)

## 2023-12-12 LAB — TROPONIN I (HIGH SENSITIVITY)
Troponin I (High Sensitivity): 4 ng/L (ref <18)
Troponin I (High Sensitivity): 4 ng/L (ref <18)

## 2023-12-12 LAB — BASIC METABOLIC PANEL
Anion gap: 11 (ref 5–15)
BUN: 11 mg/dL (ref 8–23)
CO2: 28 mmol/L (ref 22–32)
Calcium: 9.4 mg/dL (ref 8.9–10.3)
Chloride: 104 mmol/L (ref 98–111)
Creatinine, Ser: 0.78 mg/dL (ref 0.61–1.24)
GFR, Estimated: >60 mL/min (ref >60)
Glucose, Bld: 110 mg/dL — ABNORMAL HIGH (ref 70–99)
Potassium: 4 mmol/L (ref 3.5–5.1)
Sodium: 143 mmol/L (ref 135–145)

## 2023-12-12 LAB — HEPATIC FUNCTION PANEL
ALT: 18 U/L (ref 0–44)
AST: 23 U/L (ref 15–41)
Albumin: 3.8 g/dL (ref 3.5–5.0)
Alkaline Phosphatase: 48 U/L (ref 38–126)
Bilirubin, Direct: 0.1 mg/dL (ref 0.0–0.2)
Indirect Bilirubin: 0.7 mg/dL (ref 0.3–0.9)
Total Bilirubin: 0.8 mg/dL (ref 0.0–1.2)
Total Protein: 6.6 g/dL (ref 6.5–8.1)

## 2023-12-12 LAB — LIPASE, BLOOD: Lipase: 26 U/L (ref 11–51)

## 2023-12-12 MED ORDER — DIPHENHYDRAMINE HCL 50 MG/ML IJ SOLN
50.0000 mg | Freq: Once | INTRAMUSCULAR | Status: AC
  Administered 2023-12-12: 50 mg via INTRAVENOUS
  Filled 2023-12-12: qty 1

## 2023-12-12 MED ORDER — METHYLPREDNISOLONE SODIUM SUCC 40 MG IJ SOLR
40.0000 mg | Freq: Once | INTRAMUSCULAR | Status: AC
  Administered 2023-12-12: 40 mg via INTRAVENOUS
  Filled 2023-12-12: qty 1

## 2023-12-12 MED ORDER — DIPHENHYDRAMINE HCL 25 MG PO CAPS: 50.0000 mg | ORAL_CAPSULE | Freq: Once | ORAL | Status: AC

## 2023-12-12 MED ORDER — IOHEXOL 350 MG/ML SOLN
100.0000 mL | Freq: Once | INTRAVENOUS | Status: AC | PRN
  Administered 2023-12-12: 100 mL via INTRAVENOUS

## 2023-12-12 MED ORDER — MORPHINE SULFATE (PF) 4 MG/ML IV SOLN
4.0000 mg | Freq: Once | INTRAVENOUS | Status: AC
  Administered 2023-12-12: 4 mg via INTRAVENOUS
  Filled 2023-12-12: qty 1

## 2023-12-12 MED ORDER — ONDANSETRON HCL 4 MG/2ML IJ SOLN
4.0000 mg | Freq: Once | INTRAMUSCULAR | Status: AC
  Administered 2023-12-12: 4 mg via INTRAVENOUS
  Filled 2023-12-12: qty 2

## 2023-12-12 MED ORDER — OMEPRAZOLE MAGNESIUM 20 MG PO TBEC: 20.0000 mg | DELAYED_RELEASE_TABLET | Freq: Every day | ORAL | 1 refills | Status: DC

## 2023-12-12 MED ORDER — OMEPRAZOLE MAGNESIUM 20 MG PO TBEC: 20.0000 mg | DELAYED_RELEASE_TABLET | Freq: Every day | ORAL | 1 refills | Status: AC

## 2023-12-12 MED ORDER — SODIUM CHLORIDE 0.9 % IV BOLUS
500.0000 mL | Freq: Once | INTRAVENOUS | Status: AC
  Administered 2023-12-12: 500 mL via INTRAVENOUS

## 2023-12-12 NOTE — ED Provider Notes (Signed)
[facility] Provider Note    Event Date/Time   First MD Initiated Contact with Patient 12/12/23 1217     (approximate)   History   Chest Pain   HPI  Matthew Newman is a 78 y.o. male  here with chest pain. Pt reports he awoke this AM with dull, substernal chest pressure that radiated to his epigastric area and down his abd. He has a h/o chronic abdominal pain and issues and that has been going on/worsening "for a while," but the chest pain was more severe. He felt SOB with it. It did radiate to his back. Reports it is mild now. No h/o ACS. No fevers. No recent med changes.      Physical Exam   Triage Vital Signs: ED Triage Vitals  Encounter Vitals Group     BP 12/12/23 0805 131/76     Systolic BP Percentile --      Diastolic BP Percentile --      Pulse Rate 12/12/23 0805 63     Resp 12/12/23 0805 18     Temp 12/12/23 0805 97.9 F (36.6 C)     Temp Source 12/12/23 0805 Oral     SpO2 12/12/23 0805 100 %     Weight 12/12/23 0806 151 lb (68.5 kg)     Height 12/12/23 0806 6' 2.5" (1.892 m)     Head Circumference --      Peak Flow --      Pain Score 12/12/23 0805 6     Pain Loc --      Pain Education --      Exclude from Growth Chart --     Most recent vital signs: Vitals:   12/12/23 1221 12/12/23 1236  BP: 122/63   Pulse: (!) 55   Resp: 18   Temp:  98 F (36.7 C)  SpO2: 100%      General: Awake, no distress.  CV:  Good peripheral perfusion. RRR. Resp:  Normal work of breathing. Lungs clear bilaterally. Abd:  No distention. Moderate epigastric TTP, mild diffuse L sided and lower abd pain. No rebound.  Other:  No LE edema. Pulses 2+ and symmetric.   ED Results / Procedures / Treatments   Labs (all labs ordered are listed, but only abnormal results are displayed) Labs Reviewed  BASIC METABOLIC PANEL - Abnormal; Notable for the following components:      Result Value   Glucose, Bld 110 (*)    All other components within normal  limits  CBC - Abnormal; Notable for the following components:   WBC 3.5 (*)    RBC 4.10 (*)    All other components within normal limits  HEPATIC FUNCTION PANEL  LIPASE, BLOOD  TROPONIN I (HIGH SENSITIVITY)  TROPONIN I (HIGH SENSITIVITY)     EKG Normal sinus rhythm, VR 62. PR 158, QRS 90, QTc 428. No acute ST elevations or depressions. No ischemia or infarct.   RADIOLOGY CXR: Small nodular density R lung base CT: Pending   I also independently reviewed and agree with radiologist interpretations.   PROCEDURES:  Critical Care performed: No   MEDICATIONS ORDERED IN ED: Medications  diphenhydrAMINE (BENADRYL) capsule 50 mg (has no administration in time range)    Or  diphenhydrAMINE (BENADRYL) injection 50 mg (has no administration in time range)  methylPREDNISolone sodium succinate (SOLU-MEDROL) 40 mg/mL injection 40 mg (40 mg Intravenous Given 12/12/23 1313)  morphine (PF) 4 MG/ML injection 4 mg (4 mg Intravenous Given 12/12/23  1330)  sodium chloride 0.9 % bolus 500 mL (0 mLs Intravenous Stopped 12/12/23 1556)  ondansetron (ZOFRAN) injection 4 mg (4 mg Intravenous Given 12/12/23 1314)     IMPRESSION / MDM / ASSESSMENT AND PLAN / ED COURSE  I reviewed the triage vital signs and the nursing notes.                              Differential diagnosis includes, but is not limited to, atypical chest pain, anxiety, gerd/gastritis, pancreatitis, dissection, HTN urgency, referred abd pain/SBO.  Patient's presentation is most consistent with acute presentation with potential threat to life or bodily function.  The patient is on the cardiac monitor to evaluate for evidence of arrhythmia and/or significant heart rate changes  78 yo M with PMHx PUD here with chest pressure, abdominal pain. Primary suspicion is GI-related pain from gastritis, PUD, possibly IBS triggering anxiety, HTN and mild CP. Less likely ACS, dissection. Given the reported degree of HTN and pt's statement about  severity of pain w/ radiation, CT angio ordered and is pending. Otherwise, EKG is nonischemic and trops neg x 2 - do not suspect ACS. CXR does not show PNA or PTX. CBC, CMP o/w unremarkable.  Clinical Course as of 12/12/23 1635  Fri Dec 12, 2023  1514 Epigastric and chest pain radiating to back with mild dyspnea.  BP 180/100s.  History of chronic abdominal pain  CTA dissection is pending.  Labs reassuring.  Premedicated for IV contrast.  Trop neg - cleared from cardiology. If CTA neg will discuss PPI. CTA pending.  [SM]    Clinical Course User Index [SM] Corena Herter, MD     FINAL CLINICAL IMPRESSION(S) / ED DIAGNOSES   Final diagnoses:  Atypical chest pain     Rx / DC Orders   ED Discharge Orders     None        Note:  This document was prepared using Dragon voice recognition software and may include unintentional dictation errors.   Shaune Pollack, MD 12/12/23 [phone]

## 2023-12-12 NOTE — ED Provider Notes (Addendum)
Care assumed of patient from outgoing provider.  See their note for initial history, exam and plan.  Clinical Course as of 12/12/23 1516  Fri Dec 12, 2023  1514 Epigastric and chest pain radiating to back with mild dyspnea.  BP 180/100s.  History of chronic abdominal pain  CTA dissection is pending.  Labs reassuring.  Premedicated for IV contrast.  Trop neg - cleared from cardiology. If CTA neg will discuss PPI. CTA pending.  [SM]    Clinical Course User Index [SM] Corena Herter, MD   CTA negative for dissection or acute findings.  Discussed incidental findings with the patient.  Will start on a PPI.  Given return precautions.  Discussed follow-up with primary care physician. Corena Herter, MD 12/12/23 1610    Corena Herter, MD 12/12/23 [id number]

## 2023-12-12 NOTE — ED Triage Notes (Signed)
Pt advises that he was laying in bed with having chest pain. Pt sts that his HR was fast and then he called 911.

## 2023-12-12 NOTE — ED Notes (Signed)
Urine sent with save tube label to lab

## 2023-12-12 NOTE — ED Triage Notes (Signed)
Patient arrived by [facility] from home for epigastric pain. Patient poor historian for medical history.  Reports he is diabetic   EMS vitals: 365CBG 148/64 b/p 64HR 98% RA

## 2023-12-12 NOTE — ED Provider Triage Note (Signed)
Emergency Medicine Provider Triage Evaluation Note  Matthew Newman , a 78 y.o. male  was evaluated in triage.  Pt complains of chest pain that started this am. Patient was lying in bed and called 911.  States that HR was fast and he felt he could not stand up.   Review of Systems  Positive: + DM Negative: No diaphoresis, no vomiting, fever   Physical Exam  BP 131/76 (BP Location: Right Arm)   Pulse 63   Temp 97.9 F (36.6 C) (Oral)   Resp 18   Ht 6' 2.5" (1.892 m)   Wt 68.5 kg   SpO2 100%   BMI 19.13 kg/m  Gen:   Awake, no distress   Able to talk in complete sentences. Resp:  Normal effort   lungs clear  Heart RRR MSK:   Moves extremities without difficulty  Other:    Medical Decision Making  Medically screening exam initiated at 8:08 AM.  Appropriate orders placed.  Matthew Newman was informed that the remainder of the evaluation will be completed by another provider, this initial triage assessment does not replace that evaluation, and the importance of remaining in the ED until their evaluation is complete.     Tommi Rumps, [place] 12/12/23 [phone]

## 2023-12-12 NOTE — Discharge Instructions (Signed)
You were seen in the emergency department for chest and abdominal pain.  You had a CT scan done that did not show any findings to explain your pain today.  Your heart enzymes were normal and no concern for heart attack.  Concerned that you might have acid reflux that are causing your symptoms.  You were restarted on acid reflux medication.  Follow-up closely with your primary care physician for further reevaluation for your symptoms that you are seen for today.  You had an incidental finding on your CT scan of a pulmonary nodule.  Talk to your primary care physician so you can have repeat imaging study to follow this up and make sure it is not cancer.  Return to the emergency department for any worsening symptoms.  Thank you for choosing [place] for your health care, it was my pleasure to care for you today!  Corena Herter, MD

## 2023-12-14 ENCOUNTER — Other Ambulatory Visit: Payer: Self-pay

## 2023-12-14 ENCOUNTER — Emergency Department
Admission: EM | Admit: 2023-12-14 | Discharge: 2023-12-14 | Disposition: A | Discharge status: Home / Self Care | Payer: No Typology Code available for payment source | Attending: Emergency Medicine | Admitting: Emergency Medicine

## 2023-12-14 DIAGNOSIS — R1032 Left lower quadrant pain: Secondary | ICD-10-CM | POA: Insufficient documentation

## 2023-12-14 DIAGNOSIS — K5792 Diverticulitis of intestine, part unspecified, without perforation or abscess without bleeding: Secondary | ICD-10-CM

## 2023-12-14 DIAGNOSIS — K5901 Slow transit constipation: Secondary | ICD-10-CM

## 2023-12-14 LAB — COMPREHENSIVE METABOLIC PANEL
ALT: 19 U/L (ref 0–44)
AST: 26 U/L (ref 15–41)
Albumin: 3.6 g/dL (ref 3.5–5.0)
Alkaline Phosphatase: 52 U/L (ref 38–126)
Anion gap: 9 (ref 5–15)
BUN: 15 mg/dL (ref 8–23)
CO2: 28 mmol/L (ref 22–32)
Calcium: 8.8 mg/dL — ABNORMAL LOW (ref 8.9–10.3)
Chloride: 99 mmol/L (ref 98–111)
Creatinine, Ser: 0.79 mg/dL (ref 0.61–1.24)
GFR, Estimated: >60 mL/min (ref >60)
Glucose, Bld: 101 mg/dL — ABNORMAL HIGH (ref 70–99)
Potassium: 4.1 mmol/L (ref 3.5–5.1)
Sodium: 136 mmol/L (ref 135–145)
Total Bilirubin: 0.6 mg/dL (ref 0.0–1.2)
Total Protein: 6.1 g/dL — ABNORMAL LOW (ref 6.5–8.1)

## 2023-12-14 LAB — CBC
HCT: 37.4 % — ABNORMAL LOW (ref 39.0–52.0)
Hemoglobin: 12.3 g/dL — ABNORMAL LOW (ref 13.0–17.0)
MCH: 32.5 pg (ref 26.0–34.0)
MCHC: 32.9 g/dL (ref 30.0–36.0)
MCV: 98.7 fL (ref 80.0–100.0)
Platelets: 142 10*3/uL — ABNORMAL LOW (ref 150–400)
RBC: 3.79 MIL/uL — ABNORMAL LOW (ref 4.22–5.81)
RDW: 12.8 % (ref 11.5–15.5)
WBC: 4.5 10*3/uL (ref 4.0–10.5)
nRBC: 0 % (ref 0.0–0.2)

## 2023-12-14 LAB — LIPASE, BLOOD: Lipase: 34 U/L (ref 11–51)

## 2023-12-14 LAB — URINALYSIS, ROUTINE W REFLEX MICROSCOPIC
Bilirubin Urine: NEGATIVE
Glucose, UA: NEGATIVE mg/dL
Hgb urine dipstick: NEGATIVE
Ketones, ur: NEGATIVE mg/dL
Leukocytes,Ua: NEGATIVE
Nitrite: NEGATIVE
Protein, ur: NEGATIVE mg/dL
Specific Gravity, Urine: 1.017 (ref 1.005–1.030)
pH: 6 (ref 5.0–8.0)

## 2023-12-14 MED ORDER — AMOXICILLIN-POT CLAVULANATE 875-125 MG PO TABS: 1.0000 | ORAL_TABLET | Freq: Two times a day (BID) | ORAL | 0 refills | Status: AC

## 2023-12-14 MED ORDER — POLYETHYLENE GLYCOL 3350 17 G PO PACK: 17.0000 g | PACK | Freq: Every day | ORAL | 0 refills | Status: AC

## 2023-12-14 MED ORDER — DOCUSATE SODIUM 100 MG PO CAPS: 100.0000 mg | ORAL_CAPSULE | Freq: Two times a day (BID) | ORAL | 2 refills | Status: AC

## 2023-12-14 NOTE — ED Triage Notes (Signed)
Pt comes with c/o belly pain and constipation since last night. Pt denies any N/V. Pt states pain all over.

## 2023-12-14 NOTE — ED Provider Notes (Signed)
[facility] Provider Note    Event Date/Time   First MD Initiated Contact with Patient 12/14/23 1300     (approximate)   History   Abdominal Pain   HPI  Matthew Newman is a 78 y.o. male who presents with constipation and left lower quadrant abdominal discomfort over the last 2 to 3 days.  Patient was recently seen here for separate issue per review of records had CT scan which was overall reassuring but did demonstrate sigmoid diverticulosis.  He denies fevers, no nausea or vomiting.  No distention     Physical Exam   Triage Vital Signs: ED Triage Vitals  Encounter Vitals Group     BP 12/14/23 1136 (!) 118/59     Systolic BP Percentile --      Diastolic BP Percentile --      Pulse Rate 12/14/23 1136 60     Resp 12/14/23 1136 18     Temp 12/14/23 1136 98 F (36.7 C)     Temp src --      SpO2 12/14/23 1136 100 %     Weight 12/14/23 1134 68.5 kg (151 lb)     Height 12/14/23 1134 1.892 m (6' 2.5")     Head Circumference --      Peak Flow --      Pain Score 12/14/23 1133 2     Pain Loc --      Pain Education --      Exclude from Growth Chart --     Most recent vital signs: Vitals:   12/14/23 1136  BP: (!) 118/59  Pulse: 60  Resp: 18  Temp: 98 F (36.7 C)  SpO2: 100%     General: Awake, no distress.  CV:  Good peripheral perfusion.  Resp:  Normal effort.  Abd:  No distention.  Soft, mild left lower quadrant tenderness, reassuring exam overall Other:     ED Results / Procedures / Treatments   Labs (all labs ordered are listed, but only abnormal results are displayed) Labs Reviewed  COMPREHENSIVE METABOLIC PANEL - Abnormal; Notable for the following components:      Result Value   Glucose, Bld 101 (*)    Calcium 8.8 (*)    Total Protein 6.1 (*)    All other components within normal limits  CBC - Abnormal; Notable for the following components:   RBC 3.79 (*)    Hemoglobin 12.3 (*)    HCT 37.4 (*)    Platelets 142 (*)     All other components within normal limits  URINALYSIS, ROUTINE W REFLEX MICROSCOPIC - Abnormal; Notable for the following components:   Color, Urine YELLOW (*)    APPearance CLEAR (*)    All other components within normal limits  LIPASE, BLOOD     EKG     RADIOLOGY     PROCEDURES:  Critical Care performed:   Procedures   MEDICATIONS ORDERED IN ED: Medications - No data to display   IMPRESSION / MDM / ASSESSMENT AND PLAN / ED COURSE  I reviewed the triage vital signs and the nursing notes. Patient's presentation is most consistent with acute presentation with potential threat to life or bodily function.  Patient presents with abdominal pain, constipation as detailed above.  He does have some left lower quadrant tenderness to palpation.  Recent CT scan demonstrated diverticulosis in the sigmoid colon, suspect mild diverticulitis, with constipation.  Will start the patient on MiraLAX, Colace, Augmentin  Lab work  reviewed and is quite reassuring overall he is very well-appearing, appropriate for outpatient follow-up, return precautions discussed.        FINAL CLINICAL IMPRESSION(S) / ED DIAGNOSES   Final diagnoses:  None     Rx / DC Orders   ED Discharge Orders     None        Note:  This document was prepared using Dragon voice recognition software and may include unintentional dictation errors.   Jene Every, MD 12/14/23 1350

## 2023-12-19 ENCOUNTER — Emergency Department: Payer: No Typology Code available for payment source

## 2023-12-19 ENCOUNTER — Other Ambulatory Visit: Payer: Self-pay

## 2023-12-19 DIAGNOSIS — R002 Palpitations: Secondary | ICD-10-CM | POA: Diagnosis present

## 2023-12-19 NOTE — ED Triage Notes (Signed)
 Pt presents to ER from home, reports she was watching TV and got up to put in his dependents when he felt palpitations. Pt reports he never experienced this before. Pt talks in complete sentences no respiratory distress noted.

## 2023-12-20 ENCOUNTER — Encounter: Payer: Self-pay | Admitting: Emergency Medicine

## 2023-12-20 ENCOUNTER — Emergency Department
Admission: EM | Admit: 2023-12-20 | Discharge: 2023-12-20 | Disposition: A | Discharge status: Home / Self Care | Payer: No Typology Code available for payment source | Attending: Emergency Medicine | Admitting: Emergency Medicine

## 2023-12-20 DIAGNOSIS — R002 Palpitations: Secondary | ICD-10-CM

## 2023-12-20 LAB — BASIC METABOLIC PANEL
Anion gap: 12 (ref 5–15)
BUN: 12 mg/dL (ref 8–23)
CO2: 27 mmol/L (ref 22–32)
Calcium: 9.1 mg/dL (ref 8.9–10.3)
Chloride: 99 mmol/L (ref 98–111)
Creatinine, Ser: 0.76 mg/dL (ref 0.61–1.24)
GFR, Estimated: >60 mL/min (ref >60)
Glucose, Bld: 90 mg/dL (ref 70–99)
Potassium: 3.5 mmol/L (ref 3.5–5.1)
Sodium: 138 mmol/L (ref 135–145)

## 2023-12-20 LAB — CBC
HCT: 37.7 % — ABNORMAL LOW (ref 39.0–52.0)
Hemoglobin: 12.8 g/dL — ABNORMAL LOW (ref 13.0–17.0)
MCH: 33 pg (ref 26.0–34.0)
MCHC: 34 g/dL (ref 30.0–36.0)
MCV: 97.2 fL (ref 80.0–100.0)
Platelets: 178 10*3/uL (ref 150–400)
RBC: 3.88 MIL/uL — ABNORMAL LOW (ref 4.22–5.81)
RDW: 12.8 % (ref 11.5–15.5)
WBC: 4.9 10*3/uL (ref 4.0–10.5)
nRBC: 0 % (ref 0.0–0.2)

## 2023-12-20 LAB — TROPONIN I (HIGH SENSITIVITY): Troponin I (High Sensitivity): 4 ng/L (ref <18)

## 2023-12-20 LAB — T4, FREE: Free T4: 0.7 ng/dL (ref 0.61–1.12)

## 2023-12-20 LAB — MAGNESIUM: Magnesium: 2.2 mg/dL (ref 1.7–2.4)

## 2023-12-20 LAB — TSH: TSH: 6.134 u[IU]/mL — ABNORMAL HIGH (ref 0.350–4.500)

## 2023-12-20 NOTE — ED Provider Notes (Signed)
 [facility] Provider Note    Event Date/Time   First MD Initiated Contact with Patient 12/20/23 0104     (approximate)   History   Palpitations   HPI  Matthew Newman is a 78 y.o. male   Pleasant gentleman here with palpitations earlier this evening that have resolved.  He was sitting on the couch watching TV when he stood up went to the bathroom to urinate and while urinating he developed a sense of his heart racing.  He had no chest pain or shortness of breath during this time.  He had no other associated symptoms.  It resolved by the time he came to the emergency department.   He has been feeling well otherwise with no other acute medical complaints.  External Medical Documents Reviewed: Emergency department visit dated last week for suspected diverticulitis, started on antibiotics and MiraLAX       Physical Exam   Triage Vital Signs: ED Triage Vitals [12/19/23 2323]  Encounter Vitals Group     BP 134/70     Systolic BP Percentile      Diastolic BP Percentile      Pulse Rate 71     Resp 16     Temp 97.9 F (36.6 C)     Temp Source Oral     SpO2 100 %     Weight 154 lb (69.9 kg)     Height 6' 2 (1.88 m)     Head Circumference      Peak Flow      Pain Score 0     Pain Loc      Pain Education      Exclude from Growth Chart     Most recent vital signs: Vitals:   12/20/23 0035 12/20/23 0100  BP: 125/69 (!) 125/106  Pulse: (!) 127 (!) 58  Resp: 17 16  Temp:    SpO2: 99% 100%    General: Awake, no distress.  CV:  Good peripheral perfusion.  Resp:  Normal effort.  Abd:  No distention. Other:  Pleasant gentleman no acute distress with normal vital signs, normal rate and rhythm, clear lungs, appears euvolemic.   ED Results / Procedures / Treatments   Labs (all labs ordered are listed, but only abnormal results are displayed) Labs Reviewed  CBC - Abnormal; Notable for the following components:      Result Value   RBC 3.88  (*)    Hemoglobin 12.8 (*)    HCT 37.7 (*)    All other components within normal limits  TSH - Abnormal; Notable for the following components:   TSH 6.134 (*)    All other components within normal limits  BASIC METABOLIC PANEL  MAGNESIUM   T4, FREE  TROPONIN I (HIGH SENSITIVITY)  TROPONIN I (HIGH SENSITIVITY)     I ordered and reviewed the above labs they are notable for cell counts electrolytes unremarkable.  Troponin negative.  EKG  ED ECG REPORT I, Ginnie Shams, the attending physician, personally viewed and interpreted this ECG.   Date: 12/20/2023  EKG Time: 2335  Rate: 71  Rhythm: sinus  Axis: lad  Intervals:nl  ST&T Change: none    RADIOLOGY I independently reviewed and interpreted chest x-ray and see no obvious focality pneumothorax I also reviewed radiologist's formal read.   PROCEDURES:  Critical Care performed: No  Procedures   MEDICATIONS ORDERED IN ED: Medications - No data to display   IMPRESSION / MDM / ASSESSMENT AND PLAN /  ED COURSE  I reviewed the triage vital signs and the nursing notes.                                Patient's presentation is most consistent with acute presentation with potential threat to life or bodily function.  Differential diagnosis includes, but is not limited to, dysrhythmia, ACS, electrolyte disturbance    MDM:    Palpitations resolved from earlier today.  No other acute medical complaints and otherwise in his regular state of health.  Feels well now.  Cell counts electrolytes thyroid  troponin and EKG unremarkable.  Has been on cardiac monitoring without event.  Given unremarkable workup and asymptomatic currently I think he can be discharged home with close PMD follow-up and cardiology referral for Holter monitoring.  Advised to return with any new or worsening symptoms.  Nonischemic EKG and normal initial troponin, without chest pain or shortness of breath I doubt cardiopulmonary emergency like PE dissection or  ACS at this time.       FINAL CLINICAL IMPRESSION(S) / ED DIAGNOSES   Final diagnoses:  Palpitations     Rx / DC Orders   ED Discharge Orders          Ordered    Ambulatory referral to Cardiology       Comments: If you have not heard from the Cardiology office within the next 72 hours please call [phone].   12/20/23 0121             Note:  This document was prepared using Dragon voice recognition software and may include unintentional dictation errors.    Cyrena Mylar, MD 12/20/23 [phone]

## 2023-12-20 NOTE — Discharge Instructions (Addendum)
 Your testing in the emergency department did not show any emergency conditions that account for your symptoms.  Please follow-up with your primary doctor to discuss an incidental finding of a lung nodule on your x-ray.  I made a referral to a heart doctor who can schedule follow-up for long-term heart monitoring called a Holter monitor.  Thank you for choosing us  for your health care today!  Please see your primary doctor this week for a follow up appointment.   If you have any new, worsening, or unexpected symptoms call your doctor right away or come back to the emergency department for reevaluation.  It was my pleasure to care for you today.   Ginnie EDISON Cyrena, MD

## 2023-12-22 ENCOUNTER — Emergency Department
Admission: EM | Admit: 2023-12-22 | Discharge: 2023-12-22 | Discharge status: Against Medical Advice | Payer: No Typology Code available for payment source | Attending: Emergency Medicine | Admitting: Emergency Medicine

## 2023-12-22 DIAGNOSIS — Z5321 Procedure and treatment not carried out due to patient leaving prior to being seen by health care provider: Secondary | ICD-10-CM | POA: Diagnosis not present

## 2023-12-22 DIAGNOSIS — R1012 Left upper quadrant pain: Secondary | ICD-10-CM | POA: Diagnosis present

## 2023-12-22 NOTE — ED Triage Notes (Signed)
 FIRST NURSE NOTE:  Pt arrived via [facility] from home c/o LUQ abd pain, states he wants to go to the [place] for treatment, waiting for brother to come to pick him up.  Pt lives alone. On arrival to ED, pt taken to lobby and using phone to call for ride.   VS [phone]

## 2023-12-25 ENCOUNTER — Emergency Department: Payer: No Typology Code available for payment source

## 2023-12-25 ENCOUNTER — Other Ambulatory Visit: Payer: Self-pay

## 2023-12-25 ENCOUNTER — Emergency Department
Admission: EM | Admit: 2023-12-25 | Discharge: 2023-12-25 | Disposition: A | Discharge status: Home / Self Care | Payer: No Typology Code available for payment source | Attending: Emergency Medicine | Admitting: Emergency Medicine

## 2023-12-25 DIAGNOSIS — K5900 Constipation, unspecified: Secondary | ICD-10-CM | POA: Insufficient documentation

## 2023-12-25 DIAGNOSIS — R1032 Left lower quadrant pain: Secondary | ICD-10-CM | POA: Diagnosis present

## 2023-12-25 LAB — COMPREHENSIVE METABOLIC PANEL
ALT: 16 U/L (ref 0–44)
AST: 20 U/L (ref 15–41)
Albumin: 3.6 g/dL (ref 3.5–5.0)
Alkaline Phosphatase: 52 U/L (ref 38–126)
Anion gap: 6 (ref 5–15)
BUN: 9 mg/dL (ref 8–23)
CO2: 31 mmol/L (ref 22–32)
Calcium: 9 mg/dL (ref 8.9–10.3)
Chloride: 102 mmol/L (ref 98–111)
Creatinine, Ser: 0.73 mg/dL (ref 0.61–1.24)
GFR, Estimated: >60 mL/min (ref >60)
Glucose, Bld: 109 mg/dL — ABNORMAL HIGH (ref 70–99)
Potassium: 4.3 mmol/L (ref 3.5–5.1)
Sodium: 139 mmol/L (ref 135–145)
Total Bilirubin: 0.8 mg/dL (ref 0.0–1.2)
Total Protein: 6.4 g/dL — ABNORMAL LOW (ref 6.5–8.1)

## 2023-12-25 LAB — URINALYSIS, ROUTINE W REFLEX MICROSCOPIC
Bilirubin Urine: NEGATIVE
Glucose, UA: NEGATIVE mg/dL
Hgb urine dipstick: NEGATIVE
Ketones, ur: NEGATIVE mg/dL
Leukocytes,Ua: NEGATIVE
Nitrite: NEGATIVE
Protein, ur: NEGATIVE mg/dL
Specific Gravity, Urine: 1.012 (ref 1.005–1.030)
pH: 6 (ref 5.0–8.0)

## 2023-12-25 LAB — CBC
HCT: 37.8 % — ABNORMAL LOW (ref 39.0–52.0)
Hemoglobin: 12.5 g/dL — ABNORMAL LOW (ref 13.0–17.0)
MCH: 32.2 pg (ref 26.0–34.0)
MCHC: 33.1 g/dL (ref 30.0–36.0)
MCV: 97.4 fL (ref 80.0–100.0)
Platelets: 221 10*3/uL (ref 150–400)
RBC: 3.88 MIL/uL — ABNORMAL LOW (ref 4.22–5.81)
RDW: 12.8 % (ref 11.5–15.5)
WBC: 4.4 10*3/uL (ref 4.0–10.5)
nRBC: 0 % (ref 0.0–0.2)

## 2023-12-25 LAB — LIPASE, BLOOD: Lipase: 31 U/L (ref 11–51)

## 2023-12-25 MED ORDER — MAGNESIUM CITRATE PO SOLN
1.0000 | Freq: Once | ORAL | 1 refills | Status: AC
Start: 2023-12-25 — End: 2023-12-25

## 2023-12-25 NOTE — ED Notes (Signed)
Pt states that they have abdominal pain that has been going on for several days and got worse over night. Pt states that the pain is in a V on the left side of their abdomen that feels like "if I took your arm, leg and nose and twisted it; that's what it feels like." Pt also states that it was "burning something awful. It's my bowels that's causing the issue." Pt states that they haven't had issues with their bowels before but has had other "issues." Pt states that they are having issues having BM, pt says they are taking miralex but it isn't helping. Pt denies any blood in their stool but that their stools are very large in diameter and when asked how large it is "enough to pull my asshole out. Then there are marble size that comes out and then it's about 2" in diameter."

## 2023-12-25 NOTE — ED Provider Notes (Signed)
[facility] Provider Note    Event Date/Time   First MD Initiated Contact with Patient 12/25/23 1512     (approximate)   History   Abdominal Pain   HPI  Matthew Newman is a 78 y.o. male who presents to the emergency department today because of concerns for continued abdominal pain.  He states he has had this for quite some time.  However the past couple of weeks he feels like it is getting worse.  The pain is located primarily in the left lower abdomen although it does radiate across his lower abdomen.  This has been accompanied by hard stools.  He has been trying to take fruit juice without any significant relief.  Says that he was at the [place] 2 days ago for this but they did not get any imaging.     Physical Exam   Triage Vital Signs: ED Triage Vitals  Encounter Vitals Group     BP 12/25/23 1231 115/66     Systolic BP Percentile --      Diastolic BP Percentile --      Pulse Rate 12/25/23 1231 60     Resp 12/25/23 1230 16     Temp 12/25/23 1230 97.9 F (36.6 C)     Temp src --      SpO2 12/25/23 1231 98 %     Weight 12/25/23 1231 152 lb 1.9 oz (69 kg)     Height 12/25/23 1231 6\' 2"  (1.88 m)     Head Circumference --      Peak Flow --      Pain Score 12/25/23 1231 8     Pain Loc --      Pain Education --      Exclude from Growth Chart --     Most recent vital signs: Vitals:   12/25/23 1230 12/25/23 1231  BP:  115/66  Pulse:  60  Resp: 16   Temp: 97.9 F (36.6 C)   SpO2:  98%   General: Awake, alert, oriented. CV:  Good peripheral perfusion. Regular rate and rhythm. Resp:  Normal effort. Lungs clear. Abd:  No distention. Tender to palpation to the left abdomen.  ED Results / Procedures / Treatments   Labs (all labs ordered are listed, but only abnormal results are displayed) Labs Reviewed  COMPREHENSIVE METABOLIC PANEL - Abnormal; Notable for the following components:      Result Value   Glucose, Bld 109 (*)    Total Protein  6.4 (*)    All other components within normal limits  CBC - Abnormal; Notable for the following components:   RBC 3.88 (*)    Hemoglobin 12.5 (*)    HCT 37.8 (*)    All other components within normal limits  URINALYSIS, ROUTINE W REFLEX MICROSCOPIC - Abnormal; Notable for the following components:   Color, Urine YELLOW (*)    APPearance CLEAR (*)    All other components within normal limits  LIPASE, BLOOD     EKG  None   RADIOLOGY I independently interpreted and visualized the CT abd/pel. My interpretation: Large amount of stool Radiology interpretation:  IMPRESSION:  Extensive sigmoid diverticulosis. No active diverticulitis. Large  stool burden throughout the colon.    Aortic atherosclerosis.    Small amount of free fluid in the cul-de-sac.      PROCEDURES:  Critical Care performed: No   MEDICATIONS ORDERED IN ED: Medications - No data to display   IMPRESSION / MDM /  ASSESSMENT AND PLAN / ED COURSE  I reviewed the triage vital signs and the nursing notes.                              Differential diagnosis includes, but is not limited to, chronic abdominal pain, gastroenteritis, constipation, diverticulitis  Patient's presentation is most consistent with acute presentation with potential threat to life or bodily function.  Patient presents to the emergency department today with concerns for continued abdominal pain.  On exam patient is tender to the left abdomen.  Blood work without concerning leukocytosis.  Will obtain CT abdomen pelvis to further evaluate.  CT abd/pel without any evidence of infection/inflammation. Does show a large amount of stool. Do think this could explain the patient's abdominal pain and bowel habit. Discussed with the patient. Will give prescription for magnesium citrate. Patient states he already takes miralax.       FINAL CLINICAL IMPRESSION(S) / ED DIAGNOSES   Final diagnoses:  Constipation, unspecified constipation type      Note:  This document was prepared using Dragon voice recognition software and may include unintentional dictation errors.    Phineas Semen, MD 12/25/23 [phone]

## 2023-12-25 NOTE — ED Triage Notes (Signed)
Pt to ED for lower abd pain x1 year. Reports was sent by "your people" to have surgery at [facility] and hasn't felt right since. +nausea

## 2024-02-05 ENCOUNTER — Other Ambulatory Visit: Payer: Self-pay

## 2024-02-05 ENCOUNTER — Encounter: Payer: Self-pay | Admitting: Emergency Medicine

## 2024-02-05 DIAGNOSIS — R339 Retention of urine, unspecified: Secondary | ICD-10-CM | POA: Insufficient documentation

## 2024-02-05 LAB — CBC WITH DIFFERENTIAL/PLATELET
Abs Immature Granulocytes: 0.02 10*3/uL (ref 0.00–0.07)
Basophils Absolute: 0 10*3/uL (ref 0.0–0.1)
Basophils Relative: 1 %
Eosinophils Absolute: 0.1 10*3/uL (ref 0.0–0.5)
Eosinophils Relative: 1 %
HCT: 39.8 % (ref 39.0–52.0)
Hemoglobin: 13.4 g/dL (ref 13.0–17.0)
Immature Granulocytes: 0 %
Lymphocytes Relative: 24 %
Lymphs Abs: 1.3 10*3/uL (ref 0.7–4.0)
MCH: 33.2 pg (ref 26.0–34.0)
MCHC: 33.7 g/dL (ref 30.0–36.0)
MCV: 98.5 fL (ref 80.0–100.0)
Monocytes Absolute: 0.5 10*3/uL (ref 0.1–1.0)
Monocytes Relative: 9 %
Neutro Abs: 3.7 10*3/uL (ref 1.7–7.7)
Neutrophils Relative %: 65 %
Platelets: 184 10*3/uL (ref 150–400)
RBC: 4.04 MIL/uL — ABNORMAL LOW (ref 4.22–5.81)
RDW: 12.6 % (ref 11.5–15.5)
WBC: 5.6 10*3/uL (ref 4.0–10.5)
nRBC: 0 % (ref 0.0–0.2)

## 2024-02-05 NOTE — ED Triage Notes (Signed)
 Patient ambulatory to triage with steady gait, without difficulty or distress noted; pt reports urinary frequency/hesitency tonight accomp by lower abd/back pain

## 2024-02-06 ENCOUNTER — Emergency Department
Admission: EM | Admit: 2024-02-06 | Discharge: 2024-02-06 | Disposition: A | Discharge status: Home / Self Care | Attending: Emergency Medicine | Admitting: Emergency Medicine

## 2024-02-06 DIAGNOSIS — R339 Retention of urine, unspecified: Secondary | ICD-10-CM

## 2024-02-06 LAB — COMPREHENSIVE METABOLIC PANEL WITH GFR
ALT: 14 U/L (ref 0–44)
AST: 20 U/L (ref 15–41)
Albumin: 4 g/dL (ref 3.5–5.0)
Alkaline Phosphatase: 54 U/L (ref 38–126)
Anion gap: 8 (ref 5–15)
BUN: 15 mg/dL (ref 8–23)
CO2: 29 mmol/L (ref 22–32)
Calcium: 9.2 mg/dL (ref 8.9–10.3)
Chloride: 98 mmol/L (ref 98–111)
Creatinine, Ser: 0.8 mg/dL (ref 0.61–1.24)
GFR, Estimated: >60 mL/min (ref >60)
Glucose, Bld: 101 mg/dL — ABNORMAL HIGH (ref 70–99)
Potassium: 3.9 mmol/L (ref 3.5–5.1)
Sodium: 135 mmol/L (ref 135–145)
Total Bilirubin: 0.9 mg/dL (ref 0.0–1.2)
Total Protein: 6.9 g/dL (ref 6.5–8.1)

## 2024-02-06 LAB — URINALYSIS, ROUTINE W REFLEX MICROSCOPIC
Bilirubin Urine: NEGATIVE
Glucose, UA: NEGATIVE mg/dL
Hgb urine dipstick: NEGATIVE
Ketones, ur: NEGATIVE mg/dL
Leukocytes,Ua: NEGATIVE
Nitrite: NEGATIVE
Protein, ur: NEGATIVE mg/dL
Specific Gravity, Urine: 1.004 — ABNORMAL LOW (ref 1.005–1.030)
pH: 7 (ref 5.0–8.0)

## 2024-02-06 NOTE — Discharge Instructions (Signed)
 As we discussed, although you are having trouble urinating earlier, you were able to do so without difficulty in the emergency department.  The bladder scan does not show an excessive amount of urine in your bladder, and you do not need a Foley catheter at this time.  Please follow-up with your doctor later today at the [place] as scheduled.  Return to the emergency department if you develop new or worsening symptoms that concern you.

## 2024-02-06 NOTE — ED Provider Notes (Signed)
 [facility] Provider Note    Event Date/Time   First MD Initiated Contact with Patient 02/06/24 0011     (approximate)   History   No chief complaint on file.   HPI Tammie Ellsworth is a 78 y.o. male who presents for evaluation of urinary retention and hesitancy.  The history he provides is a bit unclear.  He primarily goes to the [place] and has been prescribed mupirocin ointment and ketoconazole cream to use on the head of his penis when it becomes inflamed.  He said he did so tonight because it was hurting and looked inflamed.  While afterwards he said that the swelling was better but when he went to urinate he felt like it was "clamped down on the inside", and said that he feels "like the valve would not open".  This has happened to him before in the past after surgery he has required a Foley catheter.  Once he came to the emergency department he was able to urinate spontaneously and he says he has no pain or discomfort at this time.  No burning when he urinates and no blood in his urine.  No other complaints at this time including nausea, vomiting, nor abdominal pain.  No recent fever.     Physical Exam   Triage Vital Signs: ED Triage Vitals  Encounter Vitals Group     BP 02/05/24 2324 128/78     Systolic BP Percentile --      Diastolic BP Percentile --      Pulse Rate 02/05/24 2324 68     Resp 02/05/24 2324 18     Temp 02/05/24 2324 98 F (36.7 C)     Temp Source 02/05/24 2324 Oral     SpO2 02/05/24 2324 100 %     Weight 02/05/24 2325 74.8 kg (165 lb)     Height 02/05/24 2325 1.88 m (6\' 2" )     Head Circumference --      Peak Flow --      Pain Score 02/05/24 2325 2     Pain Loc --      Pain Education --      Exclude from Growth Chart --     Most recent vital signs: Vitals:   02/05/24 2324  BP: 128/78  Pulse: 68  Resp: 18  Temp: 98 F (36.7 C)  SpO2: 100%    General: Awake, no distress.  CV:  Good peripheral perfusion.  Resp:  Normal  effort. Speaking easily and comfortably, no accessory muscle usage nor intercostal retractions.   Abd:  No distention.  No tenderness to palpation of the abdomen. Other:  Uncircumcised male.  Foreskin retracts easily and there is no obvious penile edema.  There is some inflammation just proximal to the glans penis consistent with possible mild balanitis, but there is no evidence of phimosis or paraphimosis.  No penile discharge, normal appearing scrotum with no tenderness to palpation of the testes.   ED Results / Procedures / Treatments   Labs (all labs ordered are listed, but only abnormal results are displayed) Labs Reviewed  CBC WITH DIFFERENTIAL/PLATELET - Abnormal; Notable for the following components:      Result Value   RBC 4.04 (*)    All other components within normal limits  COMPREHENSIVE METABOLIC PANEL WITH GFR - Abnormal; Notable for the following components:   Glucose, Bld 101 (*)    All other components within normal limits  URINALYSIS, ROUTINE W REFLEX MICROSCOPIC -  Abnormal; Notable for the following components:   Color, Urine STRAW (*)    APPearance CLEAR (*)    Specific Gravity, Urine 1.004 (*)    All other components within normal limits     PROCEDURES:  Critical Care performed: No  Procedures    IMPRESSION / MDM / ASSESSMENT AND PLAN / ED COURSE  I reviewed the triage vital signs and the nursing notes.                              Differential diagnosis includes, but is not limited to, nonspecific urinary retention, prostate hypertrophy, obstructive uropathy, UTI.  Patient's presentation is most consistent with acute presentation with potential threat to life or bodily function.  Labs/studies ordered: CMP, urinalysis, CBC with differential  Interventions/Medications given:  Medications - No data to display  (Note:  hospital course my include additional interventions and/or labs/studies not listed above.)   Patient may have a degree of BPH or  other mechanical issue, but he is able to urinate without difficulty at this time.  Postvoid residual bladder scan showed about 200 mL, but he was able to void more than 400 mL.  I advised the patient that there is no indication he needs a Foley catheter at this time and that it could lead to infection.  His labs are all normal including his urinalysis.  No indication for additional intervention.  [facility] and I encouraged him to keep that appointment.  I gave my usual customary return precautions.         FINAL CLINICAL IMPRESSION(S) / ED DIAGNOSES   Final diagnoses:  Urinary retention     Rx / DC Orders   ED Discharge Orders     None        Note:  This document was prepared using Dragon voice recognition software and may include unintentional dictation errors.   Loleta Rose, MD 02/06/24 0230

## 2024-02-14 ENCOUNTER — Emergency Department

## 2024-02-14 ENCOUNTER — Emergency Department
Admission: EM | Admit: 2024-02-14 | Discharge: 2024-02-14 | Disposition: A | Discharge status: Home / Self Care | Attending: Emergency Medicine | Admitting: Emergency Medicine

## 2024-02-14 ENCOUNTER — Other Ambulatory Visit: Payer: Self-pay

## 2024-02-14 DIAGNOSIS — R1032 Left lower quadrant pain: Secondary | ICD-10-CM | POA: Diagnosis present

## 2024-02-14 DIAGNOSIS — K5900 Constipation, unspecified: Secondary | ICD-10-CM | POA: Insufficient documentation

## 2024-02-14 LAB — COMPREHENSIVE METABOLIC PANEL WITH GFR
ALT: 14 U/L (ref 0–44)
AST: 23 U/L (ref 15–41)
Albumin: 3.9 g/dL (ref 3.5–5.0)
Alkaline Phosphatase: 47 U/L (ref 38–126)
Anion gap: 8 (ref 5–15)
BUN: 10 mg/dL (ref 8–23)
CO2: 28 mmol/L (ref 22–32)
Calcium: 9.2 mg/dL (ref 8.9–10.3)
Chloride: 99 mmol/L (ref 98–111)
Creatinine, Ser: 0.77 mg/dL (ref 0.61–1.24)
GFR, Estimated: >60 mL/min (ref >60)
Glucose, Bld: 92 mg/dL (ref 70–99)
Potassium: 4.1 mmol/L (ref 3.5–5.1)
Sodium: 135 mmol/L (ref 135–145)
Total Bilirubin: 1.2 mg/dL (ref 0.0–1.2)
Total Protein: 6.8 g/dL (ref 6.5–8.1)

## 2024-02-14 LAB — CBC
HCT: 39.7 % (ref 39.0–52.0)
Hemoglobin: 13.3 g/dL (ref 13.0–17.0)
MCH: 32.4 pg (ref 26.0–34.0)
MCHC: 33.5 g/dL (ref 30.0–36.0)
MCV: 96.8 fL (ref 80.0–100.0)
Platelets: 188 10*3/uL (ref 150–400)
RBC: 4.1 MIL/uL — ABNORMAL LOW (ref 4.22–5.81)
RDW: 12.6 % (ref 11.5–15.5)
WBC: 5.4 10*3/uL (ref 4.0–10.5)
nRBC: 0 % (ref 0.0–0.2)

## 2024-02-14 LAB — LIPASE, BLOOD: Lipase: 27 U/L (ref 11–51)

## 2024-02-14 NOTE — Discharge Instructions (Addendum)
 Your CT scan is as below.  Please call the GI doctor to make a follow-up appointment and I placed a primary care doctor appointment for you.  We discussed MiraLAX to try to help having a bowel movement once daily.  Return to the ER for fevers, worsening symptoms or any other concerns  IMPRESSION: 1. No hydronephrosis or nephrolithiasis. 2. Severe sigmoid diverticulosis. 3. Moderate colonic stool burden. No bowel obstruction.

## 2024-02-14 NOTE — ED Triage Notes (Signed)
 Pt via POV from home. Pt c/o lower abd pain for the past 4-5 months states that the pain got worse today. Denies NVD. Reports urine is darker than normal. Pt has a hx of diverticulitis and IBS. Pt is A&Ox4 and NAD, ambulatory to triage

## 2024-02-14 NOTE — ED Provider Notes (Signed)
 [facility] Provider Note    Event Date/Time   First MD Initiated Contact with Patient 02/14/24 1407     (approximate)   History   Abdominal Pain   HPI  Matthew Newman is a 78 y.o. male who comes in with lower abdominal pain for the past 4 to 5 months.  Patient does have a history of IBS and diverticulitis.  Patient reports they have not had a bowel movement for about 5 days but did have a bowel movement yesterday.  He reports the pain is on the left side of his lower abdomen but wraps up following the direction of his colon.  He reports having this issue previously and is seen GI long time ago he has had polyps and a Slutsky's ring in.  He denies any chest pain, shortness of breath.  Reports it feels like a stabbing pain to the left lower quadrant.  No fevers Physical Exam   Triage Vital Signs: ED Triage Vitals  Encounter Vitals Group     BP 02/14/24 1125 120/74     Systolic BP Percentile --      Diastolic BP Percentile --      Pulse Rate 02/14/24 1125 66     Resp 02/14/24 1125 18     Temp 02/14/24 1125 98.4 F (36.9 C)     Temp Source 02/14/24 1125 Oral     SpO2 02/14/24 1125 97 %     Weight 02/14/24 1125 130 lb (59 kg)     Height 02/14/24 1125 6\' 3"  (1.905 m)     Head Circumference --      Peak Flow --      Pain Score 02/14/24 1130 10     Pain Loc --      Pain Education --      Exclude from Growth Chart --     Most recent vital signs: Vitals:   02/14/24 1125  BP: 120/74  Pulse: 66  Resp: 18  Temp: 98.4 F (36.9 C)  SpO2: 97%     General: Awake, no distress.  CV:  Good peripheral perfusion.  Resp:  Normal effort.  Abd:  No distention.  Tenderness in the left lower abdomen Other:     ED Results / Procedures / Treatments   Labs (all labs ordered are listed, but only abnormal results are displayed) Labs Reviewed  CBC - Abnormal; Notable for the following components:      Result Value   RBC 4.10 (*)    All other components  within normal limits  LIPASE, BLOOD  COMPREHENSIVE METABOLIC PANEL WITH GFR  URINALYSIS, ROUTINE W REFLEX MICROSCOPIC    RADIOLOGY I have reviewed the CT personally and interpreted no evidence of obstruction   PROCEDURES:  Critical Care performed: No  Procedures   MEDICATIONS ORDERED IN ED: Medications - No data to display   IMPRESSION / MDM / ASSESSMENT AND PLAN / ED COURSE  I reviewed the triage vital signs and the nursing notes.   Patient's presentation is most consistent with acute presentation with potential threat to life or bodily function.   Differential is bowel obstruction, constipation, diverticulitis.  CBC shows normal white count.  Hemoglobin stable.  Lipase is normal CMP normal.  CT imaging ordered from triage due to patient having contrast allergy.  CT imaging shows moderate colonic stool burden no bowel obstruction severe sigmoid diverticulosis  Discussed with patient dosing of MiraLAX and GI follow-up to discuss colonoscopy, endoscopy as needed and  patient expressed understanding felt comfortable with this plan    FINAL CLINICAL IMPRESSION(S) / ED DIAGNOSES   Final diagnoses:  Constipation, unspecified constipation type     Rx / DC Orders   ED Discharge Orders     None        Note:  This document was prepared using Dragon voice recognition software and may include unintentional dictation errors.   Concha Se, MD 02/14/24 [phone]

## 2024-03-10 ENCOUNTER — Ambulatory Visit
Admission: EM | Admit: 2024-03-10 | Discharge: 2024-03-10 | Disposition: A | Discharge status: Home / Self Care | Attending: Emergency Medicine | Admitting: Emergency Medicine

## 2024-03-10 ENCOUNTER — Encounter: Payer: Self-pay | Admitting: *Deleted

## 2024-03-10 DIAGNOSIS — R21 Rash and other nonspecific skin eruption: Secondary | ICD-10-CM

## 2024-03-10 MED ORDER — PREDNISONE 20 MG PO TABS: 40.0000 mg | ORAL_TABLET | Freq: Every day | ORAL | 0 refills | Status: DC

## 2024-03-10 MED ORDER — CEPHALEXIN 500 MG PO CAPS: 500.0000 mg | ORAL_CAPSULE | Freq: Two times a day (BID) | ORAL | 0 refills | Status: AC

## 2024-03-10 NOTE — ED Provider Notes (Signed)
 [facility]    CSN: [id number] Arrival date & time: 03/10/24  1611      History   Chief Complaint Chief Complaint  Patient presents with   Rash    HPI Matthew Newman is a 78 y.o. male.   Patient presents for evaluation of erythematous rash occurring intermittently over the past 7 days.  Unknown cause.  Denies changes in diet, medication or toiletries, but does endorse recent shaving of his beard.  Denies fever, drainage, pain or pruritus.  First occurrence.  Has attempted use of a ketoconazole cream which was ineffective.    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,3-galactose    Arthritis    Back problem    L4 L5   Bronchitis    Colon polyp 2014   Diverticulitis 2014   Diverticulosis    GERD (gastroesophageal reflux disease)    Hemorrhoid    Hiatal hernia    IBS (irritable bowel syndrome)    Kidney stone    Pneumonia    Sinus problem    Ulcer of the stomach and intestine 2005    Patient Active Problem List   Diagnosis Date Noted   Notalgia 12/24/2014   Generalized abdominal pain 12/24/2014    Past Surgical History:  Procedure Laterality Date   [facility]   ESOPHAGEAL DILATION     [facility]   [facility]        Home Medications    Prior to Admission medications   Medication Sig Start Date End Date Taking? Authorizing Provider  cephALEXin (KEFLEX) 500 MG capsule Take 1 capsule (500 mg total) by mouth 2 (two) times daily for 5 days. 03/10/24 03/15/24 Yes Mee Macdonnell R, NP  predniSONE (DELTASONE) 20 MG tablet Take 2 tablets (40 mg total) by mouth daily. 03/10/24  Yes Criag Wicklund R, NP  docusate sodium  (COLACE) 100 MG capsule Take 1 capsule (100 mg total) by mouth 2 (two) times daily. 12/14/23 12/13/24  Bryson Carbine, MD  FINASTERIDE PO Take by mouth daily.    [provider]  lansoprazole (PREVACID) 15 MG capsule Take 15 mg by mouth daily at 12  noon.    [provider]  omeprazole  (PRILOSEC  OTC) 20 MG tablet Take 1 tablet (20 mg total) by mouth daily. 12/12/23 02/10/24  Viviano Ground, MD  polyethylene glycol (MIRALAX ) 17 g packet Take 17 g by mouth daily. 12/14/23   Bryson Carbine, MD    Family History History reviewed. No pertinent family history.  Social History Social History   Tobacco Use   Smoking status: Some Days    Current packs/day: 0.00    Types: Cigarettes    Last attempt to quit: 11/12/1983    Years since quitting: 40.3  Substance Use Topics   Alcohol use: No    Alcohol/week: 0.0 standard drinks of alcohol   Drug use: No     Allergies   Alpha-gal, Alpha-d-galactosidase, Aspirin , Barley grass, Cetirizine, Contrast media [iodinated contrast media], Corn oil, Doxycycline, Ibuprofen, Iodine, Lactose, Lactose intolerance (gi), Peanut-containing drug products, Simvastatin, and Shellfish allergy   Review of Systems Review of Systems  Skin:  Positive for rash.     Physical Exam Triage Vital Signs ED Triage Vitals  Encounter Vitals Group     BP 03/10/24 1656 126/64     Systolic BP Percentile --      Diastolic BP Percentile --  Pulse Rate 03/10/24 1656 71     Resp 03/10/24 1656 20     Temp 03/10/24 1656 98.2 F (36.8 C)     Temp Source 03/10/24 1656 Oral     SpO2 03/10/24 1656 97 %     Weight 03/10/24 1652 175 lb (79.4 kg)     Height 03/10/24 1652 6\' 2"  (1.88 m)     Head Circumference --      Peak Flow --      Pain Score 03/10/24 1651 0     Pain Loc --      Pain Education --      Exclude from Growth Chart --    No data found.  Updated Vital Signs BP 126/64 (BP Location: Left Arm)   Pulse 71   Temp 98.2 F (36.8 C) (Oral)   Resp 20   Ht 6\' 2"  (1.88 m)   Wt 175 lb (79.4 kg)   SpO2 97%   BMI 22.47 kg/m   Visual Acuity Right Eye Distance:   Left Eye Distance:   Bilateral Distance:    Right Eye Near:   Left Eye Near:    Bilateral Near:     Physical Exam Constitutional:       Appearance: Normal appearance.  Eyes:     Extraocular Movements: Extraocular movements intact.  Pulmonary:     Effort: Pulmonary effort is normal.  Skin:    Comments: Erythematous macular rash present to the bilateral cheeks, jaw and chin, nondraining, nontender, no crusting noted  Neurological:     Mental Status: He is alert and oriented to person, place, and time. Mental status is at baseline.      UC Treatments / Results  Labs (all labs ordered are listed, but only abnormal results are displayed) Labs Reviewed - No data to display  EKG   Radiology No results found.  Procedures Procedures (including critical care time)  Medications Ordered in UC Medications - No data to display  Initial Impression / Assessment and Plan / UC Course  I have reviewed the triage vital signs and the nursing notes.  Pertinent labs & imaging results that were available during my care of the patient were reviewed by me and considered in my medical decision making (see chart for details).  Rash  Presentation most consistent with a folliculitis therefore we will provide bacterial as well as inflammatory coverage, cephalexin and prednisone prescribed, discussed administration, advised against long exposure to heat and advised follow-up for persisting or worsening symptoms Final Clinical Impressions(s) / UC Diagnoses   Final diagnoses:  Rash     Discharge Instructions      Your evaluated for your rash, due to the placement I do have concerns for infection as there can be irritation to the hair follicle within your beard  Begin cephalexin every morning and every evening for 5 days for bacterial coverage  Starting tomorrow begin prednisone every morning with food for 5 days to reduce inflammation  Avoid long exposure to heat as this can cause irritation to the skin  If your symptoms do not improve please follow-up for reevaluation   ED Prescriptions     Medication Sig Dispense Auth.  Provider   cephALEXin (KEFLEX) 500 MG capsule Take 1 capsule (500 mg total) by mouth 2 (two) times daily for 5 days. 10 capsule Mykael Batz R, NP   predniSONE (DELTASONE) 20 MG tablet Take 2 tablets (40 mg total) by mouth daily. 10 tablet Atalie Oros R, NP  PDMP not reviewed this encounter.   Reena Canning, NP 03/10/24 [phone]

## 2024-03-10 NOTE — Discharge Instructions (Addendum)
 Your evaluated for your rash, due to the placement I do have concerns for infection as there can be irritation to the hair follicle within your beard  Begin cephalexin every morning and every evening for 5 days for bacterial coverage  Starting tomorrow begin prednisone every morning with food for 5 days to reduce inflammation  Avoid long exposure to heat as this can cause irritation to the skin  If your symptoms do not improve please follow-up for reevaluation

## 2024-03-10 NOTE — ED Triage Notes (Signed)
 Patient states red rash around mouth for past several days, gets really red and irritating at times.

## 2024-03-28 ENCOUNTER — Ambulatory Visit
Admission: EM | Admit: 2024-03-28 | Discharge: 2024-03-28 | Disposition: A | Discharge status: Home / Self Care | Attending: Emergency Medicine | Admitting: Emergency Medicine

## 2024-03-28 DIAGNOSIS — B3742 Candidal balanitis: Secondary | ICD-10-CM

## 2024-03-28 MED ORDER — NYSTATIN 100000 UNIT/GM EX CREA: TOPICAL_CREAM | CUTANEOUS | 2 refills | Status: AC

## 2024-03-28 NOTE — Discharge Instructions (Addendum)
 Use the nystatin cream as directed twice a day for 3 weeks.  Follow-up with your primary care provider.

## 2024-03-28 NOTE — ED Triage Notes (Addendum)
 Patient to Urgent Care with complaints of approx 1 year he has had a "red-streak" extending from the tip of his penis. Denies any urinary symptoms. Seen by his pcp at the [place] for this.   Using an anti-fungal powder.

## 2024-03-28 NOTE — ED Provider Notes (Signed)
 Matthew Newman    CSN: [id number] Arrival date & time: 03/28/24  0802      History   Chief Complaint Chief Complaint  Patient presents with   Groin Pain    HPI Matthew Newman is a 78 y.o. male.  Patient presents with tender red rash on the tip of his penis x 1 year.  He has been seen by his PCP at the [facility] for this and has been treating it with miconazole powder.  No penile discharge or dysuria.  No sexual activity in approximately 15-20 years and no concern for STDs.    The history is provided by the patient and medical records.    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,3-galactose    Arthritis    Back problem    L4 L5   Bronchitis    Colon polyp 2014   Diverticulitis 2014   Diverticulosis    GERD (gastroesophageal reflux disease)    Hemorrhoid    Hiatal hernia    IBS (irritable bowel syndrome)    Kidney stone    Pneumonia    Sinus problem    Ulcer of the stomach and intestine 2005    Patient Active Problem List   Diagnosis Date Noted   Notalgia 12/24/2014   Generalized abdominal pain 12/24/2014    Past Surgical History:  Procedure Laterality Date   [facility]   ESOPHAGEAL DILATION     [facility]   [facility]        Home Medications    Prior to Admission medications   Medication Sig Start Date End Date Taking? Authorizing Provider  nystatin cream (MYCOSTATIN) Apply to affected area 2 times daily 03/28/24  Yes Wellington Half, NP  docusate sodium  (COLACE) 100 MG capsule Take 1 capsule (100 mg total) by mouth 2 (two) times daily. 12/14/23 12/13/24  Bryson Carbine, MD  FINASTERIDE PO Take by mouth daily.    [provider]  lansoprazole (PREVACID) 15 MG capsule Take 15 mg by mouth daily at 12 noon.    [provider]  omeprazole  (PRILOSEC  OTC) 20 MG tablet Take 1 tablet (20 mg total) by mouth daily. 12/12/23 02/10/24  Viviano Ground, MD   polyethylene glycol (MIRALAX ) 17 g packet Take 17 g by mouth daily. 12/14/23   Bryson Carbine, MD  predniSONE  (DELTASONE ) 20 MG tablet Take 2 tablets (40 mg total) by mouth daily. Patient not taking: Reported on 03/28/2024 03/10/24   Reena Canning, NP    Family History History reviewed. No pertinent family history.  Social History Social History   Tobacco Use   Smoking status: Some Days    Current packs/day: 0.00    Types: Cigarettes    Last attempt to quit: 11/12/1983    Years since quitting: 40.4  Substance Use Topics   Alcohol use: No    Alcohol/week: 0.0 standard drinks of alcohol   Drug use: No     Allergies   Alpha-gal, Alpha-d-galactosidase, Aspirin , Barley grass, Cetirizine, Contrast media [iodinated contrast media], Corn oil, Doxycycline, Ibuprofen, Iodine, Lactose, Lactose intolerance (gi), Peanut-containing drug products, Simvastatin, and Shellfish allergy   Review of Systems Review of Systems  Constitutional:  Negative for chills and fever.  Gastrointestinal:  Negative for abdominal pain.  Genitourinary:  Negative for decreased urine volume, difficulty urinating, dysuria, hematuria, penile discharge and testicular pain.  Skin:  Positive for color  change and rash.     Physical Exam Triage Vital Signs ED Triage Vitals  Encounter Vitals Group     BP      Systolic BP Percentile      Diastolic BP Percentile      Pulse      Resp      Temp      Temp src      SpO2      Weight      Height      Head Circumference      Peak Flow      Pain Score      Pain Loc      Pain Education      Exclude from Growth Chart    No data found.  Updated Vital Signs BP 110/77   Pulse 71   Temp 98.4 F (36.9 C)   Resp 20   SpO2 97%   Visual Acuity Right Eye Distance:   Left Eye Distance:   Bilateral Distance:    Right Eye Near:   Left Eye Near:    Bilateral Near:     Physical Exam Exam conducted with a chaperone present Alston Jerry, Charity fundraiser).  Constitutional:       General: He is not in acute distress. HENT:     Mouth/Throat:     Mouth: Mucous membranes are moist.  Cardiovascular:     Rate and Rhythm: Normal rate and regular rhythm.  Pulmonary:     Effort: Pulmonary effort is normal. No respiratory distress.  Genitourinary:    Penis: Erythema present. No discharge or lesions.      Testes: Normal.     Epididymis:     Right: Normal.     Left: Normal.     Comments: Skin erythema on upper shaft and glans. No penile discharge.  Slight edema of glans. Neurological:     Mental Status: He is alert.      UC Treatments / Results  Labs (all labs ordered are listed, but only abnormal results are displayed) Labs Reviewed - No data to display  EKG   Radiology No results found.  Procedures Procedures (including critical care time)  Medications Ordered in UC Medications - No data to display  Initial Impression / Assessment and Plan / UC Course  I have reviewed the triage vital signs and the nursing notes.  Pertinent labs & imaging results that were available during my care of the patient were reviewed by me and considered in my medical decision making (see chart for details).    Candidal balanitis.  Patient has attempted treatment with antifungal powder without relief.  Treating today with nystatin twice a day x 3 weeks.  Instructed him to follow-up with his PCP at the [place].  Education provided on candidal balanitis.  He agrees to plan of care.  Final Clinical Impressions(s) / UC Diagnoses   Final diagnoses:  Candidal balanitis     Discharge Instructions      Use the nystatin cream as directed twice a day for 3 weeks.  Follow-up with your primary care provider.   ED Prescriptions     Medication Sig Dispense Auth. Provider   nystatin cream (MYCOSTATIN) Apply to affected area 2 times daily 30 g Wellington Half, NP      PDMP not reviewed this encounter.   Wellington Half, NP 03/28/24 [phone]

## 2024-04-07 ENCOUNTER — Emergency Department
Admission: EM | Admit: 2024-04-07 | Discharge: 2024-04-07 | Disposition: A | Discharge status: Home / Self Care | Attending: Emergency Medicine | Admitting: Emergency Medicine

## 2024-04-07 ENCOUNTER — Emergency Department

## 2024-04-07 ENCOUNTER — Ambulatory Visit: Admission: EM | Admit: 2024-04-07 | Discharge: 2024-04-07 | Disposition: A | Discharge status: Home / Self Care

## 2024-04-07 DIAGNOSIS — B3742 Candidal balanitis: Secondary | ICD-10-CM | POA: Insufficient documentation

## 2024-04-07 DIAGNOSIS — K5792 Diverticulitis of intestine, part unspecified, without perforation or abscess without bleeding: Secondary | ICD-10-CM | POA: Insufficient documentation

## 2024-04-07 DIAGNOSIS — R1032 Left lower quadrant pain: Secondary | ICD-10-CM

## 2024-04-07 DIAGNOSIS — R109 Unspecified abdominal pain: Secondary | ICD-10-CM | POA: Diagnosis present

## 2024-04-07 LAB — COMPREHENSIVE METABOLIC PANEL WITH GFR
ALT: 14 U/L (ref 0–44)
AST: 21 U/L (ref 15–41)
Albumin: 3.7 g/dL (ref 3.5–5.0)
Alkaline Phosphatase: 46 U/L (ref 38–126)
Anion gap: 6 (ref 5–15)
BUN: 10 mg/dL (ref 8–23)
CO2: 28 mmol/L (ref 22–32)
Calcium: 9.1 mg/dL (ref 8.9–10.3)
Chloride: 104 mmol/L (ref 98–111)
Creatinine, Ser: 0.76 mg/dL (ref 0.61–1.24)
GFR, Estimated: >60 mL/min (ref >60)
Glucose, Bld: 108 mg/dL — ABNORMAL HIGH (ref 70–99)
Potassium: 4 mmol/L (ref 3.5–5.1)
Sodium: 138 mmol/L (ref 135–145)
Total Bilirubin: 0.7 mg/dL (ref 0.0–1.2)
Total Protein: 6.5 g/dL (ref 6.5–8.1)

## 2024-04-07 LAB — CBC
HCT: 41.3 % (ref 39.0–52.0)
Hemoglobin: 13.6 g/dL (ref 13.0–17.0)
MCH: 32.3 pg (ref 26.0–34.0)
MCHC: 32.9 g/dL (ref 30.0–36.0)
MCV: 98.1 fL (ref 80.0–100.0)
Platelets: 207 10*3/uL (ref 150–400)
RBC: 4.21 MIL/uL — ABNORMAL LOW (ref 4.22–5.81)
RDW: 12.3 % (ref 11.5–15.5)
WBC: 4.5 10*3/uL (ref 4.0–10.5)
nRBC: 0 % (ref 0.0–0.2)

## 2024-04-07 LAB — LIPASE, BLOOD: Lipase: 30 U/L (ref 11–51)

## 2024-04-07 LAB — TROPONIN I (HIGH SENSITIVITY): Troponin I (High Sensitivity): 4 ng/L (ref <18)

## 2024-04-07 MED ORDER — AMOXICILLIN-POT CLAVULANATE 875-125 MG PO TABS: 1.0000 | ORAL_TABLET | Freq: Two times a day (BID) | ORAL | 0 refills | Status: AC

## 2024-04-07 MED ORDER — CLOTRIMAZOLE 1 % EX CREA: 1.0000 | TOPICAL_CREAM | Freq: Two times a day (BID) | CUTANEOUS | 0 refills | Status: AC

## 2024-04-07 MED ORDER — TRAMADOL HCL 50 MG PO TABS: 50.0000 mg | ORAL_TABLET | Freq: Four times a day (QID) | ORAL | 0 refills | Status: DC | PRN

## 2024-04-07 NOTE — ED Triage Notes (Signed)
 Pt states he is having "LLQ and LUQ x a few years".   Pt states he wants to have imaging CT or mri done today. Spoke with pt that we only have x-ray here. Spoke with provider who went and told pt that. Pt states he wanted to go to the ER for more imaging and labs that we can not do here today.

## 2024-04-07 NOTE — ED Provider Notes (Signed)
 Patient being sent to the nearest emergency department for evaluation of left lower abdominal pain, endorses has been going intermittently for years, endorses progressively worsening.  Has left lower quadrant abdominal pain on exam, patient like to proceed with imaging.  To escort self   Reena Canning, NP 04/07/24 [phone]

## 2024-04-07 NOTE — ED Notes (Signed)
 See triage notes. Patient c/o left sided chest pain. Patient stated he has a "normal pain" that starts under his chest muscle and moves towards the sternum and down about two to three inches. Patient stated that the pain this morning went down to this waist line like "being popped in the nose or having your toes stepped on."

## 2024-04-07 NOTE — ED Triage Notes (Signed)
 Pt to ED via POV from [facility]. Pt seen at [facility] and sent to ED for further evaluation of generalized abd pain that has been getting worse. Pt reports yesterday had sharp stabbing pain in LLQ. Pt also reports SOB w/ exertion.

## 2024-04-07 NOTE — ED Provider Notes (Signed)
 [facility] Provider Note    Event Date/Time   First MD Initiated Contact with Patient 04/07/24 [phone]     (approximate)   History   Abdominal Pain   HPI  Matthew Newman is a 78 y.o. male sent from urgent care for evaluation of abdominal pain.  Patient describes somewhat chronic abdominal pain but does seem to be worsening here in the last several days.  He does have a history of kidney stones in the distant past.  No dysuria.  Reports bilateral hernias, reducible and not painful at this time.     Physical Exam   Triage Vital Signs: ED Triage Vitals  Encounter Vitals Group     BP 04/07/24 0949 104/66     Systolic BP Percentile --      Diastolic BP Percentile --      Pulse Rate 04/07/24 0949 (!) 55     Resp 04/07/24 0949 20     Temp 04/07/24 0949 (!) 97.2 F (36.2 C)     Temp Source 04/07/24 0949 Oral     SpO2 04/07/24 0949 97 %     Weight --      Height --      Head Circumference --      Peak Flow --      Pain Score 04/07/24 0942 7     Pain Loc --      Pain Education --      Exclude from Growth Chart --     Most recent vital signs: Vitals:   04/07/24 0949  BP: 104/66  Pulse: (!) 55  Resp: 20  Temp: (!) 97.2 F (36.2 C)  SpO2: 97%     General: Awake, no distress.  CV:  Good peripheral perfusion.  Resp:  Normal effort.  Abd:  No distention.  Tenderness left lower quadrant Other:  GU: Mild likely fungal balanitis   ED Results / Procedures / Treatments   Labs (all labs ordered are listed, but only abnormal results are displayed) Labs Reviewed  COMPREHENSIVE METABOLIC PANEL WITH GFR - Abnormal; Notable for the following components:      Result Value   Glucose, Bld 108 (*)    All other components within normal limits  CBC - Abnormal; Notable for the following components:   RBC 4.21 (*)    All other components within normal limits  LIPASE, BLOOD  URINALYSIS, ROUTINE W REFLEX MICROSCOPIC  TROPONIN I (HIGH SENSITIVITY)      EKG  ED ECG REPORT I, Bryson Carbine, the attending physician, personally viewed and interpreted this ECG.  Date: 04/07/2024  Rhythm: normal sinus rhythm QRS Axis: normal Intervals: normal ST/T Wave abnormalities: normal Narrative Interpretation: no evidence of acute ischemia    RADIOLOGY CT abdomen pelvis pending Chest x-ray viewed interpret by me, no acute abnormality   PROCEDURES:  Critical Care performed:   Procedures   MEDICATIONS ORDERED IN ED: Medications - No data to display   IMPRESSION / MDM / ASSESSMENT AND PLAN / ED COURSE  I reviewed the triage vital signs and the nursing notes. Patient's presentation is most consistent with acute presentation with potential threat to life or bodily function.  Patient presents with abdominal pain as detailed above, differential includes diverticulitis, ureterolithiasis, less likely pancreatitis.  Will send for CT renal stone study  The patient also reported to triage nurse that he has mild dyspnea with exertion but no chest pain.  EKG is unremarkable, chest x-ray without acute normality, mild hyperinflation  noted  CT scan with acute versus subacute diverticulitis, no evidence of rupture or abscess.  Will start the patient on Augmentin , clotrimazole for balanitis, close PCP follow-up recommended, strict return precautions, he agrees with this plan.      FINAL CLINICAL IMPRESSION(S) / ED DIAGNOSES   Final diagnoses:  Diverticulitis  Candidal balanitis     Rx / DC Orders   ED Discharge Orders          Ordered    amoxicillin -clavulanate (AUGMENTIN ) 875-125 MG tablet  2 times daily        04/07/24 1302    clotrimazole (LOTRIMIN) 1 % cream  2 times daily        04/07/24 1302    traMADol (ULTRAM) 50 MG tablet  Every 6 hours PRN        04/07/24 1302             Note:  This document was prepared using Dragon voice recognition software and may include unintentional dictation errors.   Bryson Carbine, MD 04/07/24 [phone]

## 2024-04-10 ENCOUNTER — Emergency Department

## 2024-04-10 ENCOUNTER — Emergency Department
Admission: EM | Admit: 2024-04-10 | Discharge: 2024-04-10 | Disposition: A | Discharge status: Home / Self Care | Attending: Emergency Medicine | Admitting: Emergency Medicine

## 2024-04-10 DIAGNOSIS — R1030 Lower abdominal pain, unspecified: Secondary | ICD-10-CM

## 2024-04-10 DIAGNOSIS — K5792 Diverticulitis of intestine, part unspecified, without perforation or abscess without bleeding: Secondary | ICD-10-CM | POA: Diagnosis not present

## 2024-04-10 LAB — LIPASE, BLOOD: Lipase: 32 U/L (ref 11–51)

## 2024-04-10 LAB — URINALYSIS, ROUTINE W REFLEX MICROSCOPIC
Bilirubin Urine: NEGATIVE
Glucose, UA: NEGATIVE mg/dL
Hgb urine dipstick: NEGATIVE
Ketones, ur: NEGATIVE mg/dL
Leukocytes,Ua: NEGATIVE
Nitrite: NEGATIVE
Protein, ur: NEGATIVE mg/dL
Specific Gravity, Urine: 1.013 (ref 1.005–1.030)
pH: 6 (ref 5.0–8.0)

## 2024-04-10 LAB — COMPREHENSIVE METABOLIC PANEL WITH GFR
ALT: 13 U/L (ref 0–44)
AST: 22 U/L (ref 15–41)
Albumin: 3.8 g/dL (ref 3.5–5.0)
Alkaline Phosphatase: 45 U/L (ref 38–126)
Anion gap: 9 (ref 5–15)
BUN: 12 mg/dL (ref 8–23)
CO2: 26 mmol/L (ref 22–32)
Calcium: 9 mg/dL (ref 8.9–10.3)
Chloride: 100 mmol/L (ref 98–111)
Creatinine, Ser: 0.83 mg/dL (ref 0.61–1.24)
GFR, Estimated: >60 mL/min (ref >60)
Glucose, Bld: 117 mg/dL — ABNORMAL HIGH (ref 70–99)
Potassium: 4.4 mmol/L (ref 3.5–5.1)
Sodium: 135 mmol/L (ref 135–145)
Total Bilirubin: 0.9 mg/dL (ref 0.0–1.2)
Total Protein: 6.8 g/dL (ref 6.5–8.1)

## 2024-04-10 LAB — CBC
HCT: 40.1 % (ref 39.0–52.0)
Hemoglobin: 13.5 g/dL (ref 13.0–17.0)
MCH: 32.2 pg (ref 26.0–34.0)
MCHC: 33.7 g/dL (ref 30.0–36.0)
MCV: 95.7 fL (ref 80.0–100.0)
Platelets: 192 10*3/uL (ref 150–400)
RBC: 4.19 MIL/uL — ABNORMAL LOW (ref 4.22–5.81)
RDW: 12.2 % (ref 11.5–15.5)
WBC: 5.1 10*3/uL (ref 4.0–10.5)
nRBC: 0 % (ref 0.0–0.2)

## 2024-04-10 MED ORDER — HYDROMORPHONE HCL 1 MG/ML IJ SOLN
1.0000 mg | Freq: Once | INTRAMUSCULAR | Status: AC
  Administered 2024-04-10: 1 mg via INTRAVENOUS
  Filled 2024-04-10: qty 1

## 2024-04-10 MED ORDER — IOHEXOL 300 MG/ML  SOLN
100.0000 mL | Freq: Once | INTRAMUSCULAR | Status: AC | PRN
  Administered 2024-04-10: 100 mL via INTRAVENOUS

## 2024-04-10 MED ORDER — LACTATED RINGERS IV BOLUS
1000.0000 mL | Freq: Once | INTRAVENOUS | Status: AC
  Administered 2024-04-10: 1000 mL via INTRAVENOUS

## 2024-04-10 MED ORDER — OXYCODONE HCL 5 MG PO TABS: 5.0000 mg | ORAL_TABLET | Freq: Three times a day (TID) | ORAL | 0 refills | Status: DC | PRN

## 2024-04-10 MED ORDER — ONDANSETRON HCL 4 MG/2ML IJ SOLN
4.0000 mg | Freq: Once | INTRAMUSCULAR | Status: AC
  Administered 2024-04-10: 4 mg via INTRAVENOUS
  Filled 2024-04-10: qty 2

## 2024-04-10 NOTE — Discharge Instructions (Addendum)
 Use Tylenol for pain and fevers.  Up to 1000 mg per dose, up to 4 times per day.  Do not take more than 4000 mg of Tylenol/acetaminophen within 24 hours..  Use oxycodone as needed for more severe/breakthrough pain

## 2024-04-10 NOTE — ED Provider Notes (Signed)
 [facility] Provider Note    Event Date/Time   First MD Initiated Contact with Patient 04/10/24 1011     (approximate)   History   Abdominal Pain   HPI  Matthew Newman is a 78 y.o. male who presents to the ED for evaluation of Abdominal Pain   Review ED visit from 3 days ago at clinic visit from this morning.  Acute LLQ pain, diagnosed with diverticulitis by noncontrast CT, started on Augmentin .  Patient returns to the ED for evaluation of acutely worsening and now migratory pain.  Reports his left-sided pain has crossed to the RLQ and has become more severe.  No fevers, acute stool or urinary changes, emesis or other changes alongside this worsening pain.  He reports compliance with his Augmentin  prescription.   Physical Exam   Triage Vital Signs: ED Triage Vitals  Encounter Vitals Group     BP 04/10/24 1004 105/71     Systolic BP Percentile --      Diastolic BP Percentile --      Pulse Rate 04/10/24 1004 70     Resp 04/10/24 1004 16     Temp 04/10/24 1004 97.9 F (36.6 C)     Temp Source 04/10/24 1004 Oral     SpO2 04/10/24 1004 100 %     Weight 04/10/24 1005 153 lb (69.4 kg)     Height 04/10/24 1005 6\' 2"  (1.88 m)     Head Circumference --      Peak Flow --      Pain Score 04/10/24 1005 3     Pain Loc --      Pain Education --      Exclude from Growth Chart --     Most recent vital signs: Vitals:   04/10/24 1004 04/10/24 1100  BP: 105/71 126/78  Pulse: 70 (!) 56  Resp: 16   Temp: 97.9 F (36.6 C)   SpO2: 100% 99%    General: Awake, no distress.  CV:  Good peripheral perfusion.  Resp:  Normal effort.  Abd:  No distention.  Diffuse lower abdominal tenderness with guarding on deeper palpation. MSK:  No deformity noted.  Neuro:  No focal deficits appreciated. Other:     ED Results / Procedures / Treatments   Labs (all labs ordered are listed, but only abnormal results are displayed) Labs Reviewed  COMPREHENSIVE  METABOLIC PANEL WITH GFR - Abnormal; Notable for the following components:      Result Value   Glucose, Bld 117 (*)    All other components within normal limits  CBC - Abnormal; Notable for the following components:   RBC 4.19 (*)    All other components within normal limits  URINALYSIS, ROUTINE W REFLEX MICROSCOPIC - Abnormal; Notable for the following components:   Color, Urine YELLOW (*)    APPearance CLEAR (*)    All other components within normal limits  URINE CULTURE  LIPASE, BLOOD    EKG   RADIOLOGY CT abdomen/pelvis interpreted by me without evidence of acute abscess or perforation  Official radiology report(s): CT ABDOMEN PELVIS W CONTRAST Result Date: 04/10/2024 CLINICAL DATA:  Recent diagnosis of acute diverticulitis, acutely worsening pain. Evaluate complicating features such as abscess or perforation EXAM: CT ABDOMEN AND PELVIS WITH CONTRAST TECHNIQUE: Multidetector CT imaging of the abdomen and pelvis was performed using the standard protocol following bolus administration of intravenous contrast. RADIATION DOSE REDUCTION: This exam was performed according to the departmental dose-optimization program which  includes automated exposure control, adjustment of the mA and/or kV according to patient size and/or use of iterative reconstruction technique. CONTRAST:  OMNIPAQUE  IOHEXOL  300 MG/ML  SOLN COMPARISON:  February 14, 2024, Apr 05, 2024 FINDINGS: Lower chest: No focal airspace consolidation or pleural effusion. Hepatobiliary: A couple of small hypodensities in the right hepatic lobe, 1 of which demonstrates peripheral nodular enhancement, likely a combination of small hemangiomas and cysts.No radiopaque stones or wall thickening of the gallbladder. No intrahepatic or extrahepatic biliary ductal dilation. The portal veins are patent. Pancreas: No mass or main ductal dilation. No peripancreatic inflammation or fluid collection. Spleen: Normal size. No mass. Adrenals/Urinary  Tract: No adrenal masses. No renal mass. No nephrolithiasis or hydronephrosis. Partially distended urinary bladder without visualized abnormality. Stomach/Bowel: The stomach is decompressed without focal abnormality. Small bowel feces sign throughout the distal small bowel in the pelvis. No small bowel obstruction.The appendix was not visualized. No right lower quadrant or pericecal inflammatory changes to suggest acute appendicitis. Extensive descending and sigmoid colonic diverticulosis again noted. The subtle hazy stranding of the sigmoid mesocolon fat is less conspicuous due to multiple clustered segments of small bowel in the pelvis. No fluid collection or abscess noted in the pelvis. Vascular/Lymphatic: No aortic aneurysm. Diffuse aortoiliac atherosclerosis. No intraabdominal or pelvic lymphadenopathy. Reproductive: Mild to moderate prostatomegaly.No free pelvic fluid. Other: No pneumoperitoneum or ascites. Musculoskeletal: No acute fracture or destructive lesion. Ventral abdominal hernia repair changes again noted. No associated fluid collection. Multilevel degenerative disc disease of the spine. Osteopenia. IMPRESSION: 1. Extensive descending and sigmoid colonic diverticulosis again noted. The subtle hazy stranding of the sigmoid mesocolon fat is less conspicuous on today's exam due to multiple clustered segments of small bowel in the pelvis. No fluid collection or abscess noted in the pelvis. No pneumoperitoneum. 2. Mild to moderate prostatomegaly. Electronically Signed   By: Rance Burrows M.D.   On: 04/10/2024 12:28    PROCEDURES and INTERVENTIONS:  Procedures  Medications  HYDROmorphone (DILAUDID) injection 1 mg (1 mg Intravenous Given 04/10/24 1045)  iohexol  (OMNIPAQUE ) 300 MG/ML solution 100 mL (100 mLs Intravenous Contrast Given 04/10/24 1130)  ondansetron  (ZOFRAN ) injection 4 mg (4 mg Intravenous Given 04/10/24 1258)  lactated ringers bolus 1,000 mL (1,000 mLs Intravenous New Bag/Given  04/10/24 1256)     IMPRESSION / MDM / ASSESSMENT AND PLAN / ED COURSE  I reviewed the triage vital signs and the nursing notes.  Differential diagnosis includes, but is not limited to, continued symptomatic diverticulitis, abscess, perforation, peritonitis, SBO  {Patient presents with symptoms of an acute illness or injury that is potentially life-threatening.  Patient presents with continued lower abdominal pain in the setting of acute diverticulitis and suitable for outpatient management.  Localized tenderness on exam without peritoneal features or guarding.  Blood work without leukocytosis or significant metabolic derangements.  Urine is clear.  CT without evidence of abscess, perforation or further complicating features.  Pain is controlled and he is suitable for outpatient management.  Discussed the importance of adherence to his Augmentin  prescription.  Discussed ED return precautions.  Discharged with short course of oxycodone to help with his analgesia.  No apparent reaction or allergy to IV contrast material  Clinical Course as of 04/10/24 1411  Sat Apr 10, 2024  1035 I noted previously documented iodinated contrast allergy, documented as causing him to "pass out" so I reassessed the patient and discussed this with him.  He reports he does not remember this and does not think he has  an allergy to contrast media for CT scans.  We discussed risks and benefits and he is okay with going ahead and trying IV contrast to facilitate a better diagnostic CT scan [DS]  1244 Reassessed.  Patient reports doing okay.  We discussed reassuring imaging without complicating features of his recently diagnosed diverticulitis.  We discussed plan of care.  He is agreeable. [DS]    Clinical Course User Index [DS] Arline Bennett, MD     FINAL CLINICAL IMPRESSION(S) / ED DIAGNOSES   Final diagnoses:  Lower abdominal pain  Diverticulitis     Rx / DC Orders   ED Discharge Orders          Ordered     oxyCODONE (ROXICODONE) 5 MG immediate release tablet  Every 8 hours PRN        04/10/24 1343             Note:  This document was prepared using Dragon voice recognition software and may include unintentional dictation errors.   Arline Bennett, MD 04/10/24 [phone]

## 2024-04-10 NOTE — ED Triage Notes (Signed)
 Pt sent over by [facility] for abd pain with n/v while being on antibiotics for diverticulitis. Pt seems slightly confused in triage, states he did drive himself to his appointment today but is unsure why he was being seen.

## 2024-04-10 NOTE — ED Notes (Signed)
 Pt instructed to press call bell when ready to go to the bathroom.

## 2024-04-11 LAB — URINE CULTURE: Culture: NO GROWTH

## 2024-04-18 ENCOUNTER — Emergency Department

## 2024-04-18 ENCOUNTER — Emergency Department
Admission: EM | Admit: 2024-04-18 | Discharge: 2024-04-18 | Disposition: A | Discharge status: Home / Self Care | Attending: Emergency Medicine | Admitting: Emergency Medicine

## 2024-04-18 DIAGNOSIS — K5900 Constipation, unspecified: Secondary | ICD-10-CM | POA: Insufficient documentation

## 2024-04-18 DIAGNOSIS — R1032 Left lower quadrant pain: Secondary | ICD-10-CM | POA: Diagnosis present

## 2024-04-18 DIAGNOSIS — E871 Hypo-osmolality and hyponatremia: Secondary | ICD-10-CM | POA: Diagnosis not present

## 2024-04-18 LAB — COMPREHENSIVE METABOLIC PANEL WITH GFR
ALT: 15 U/L (ref 0–44)
AST: 30 U/L (ref 15–41)
Albumin: 3.8 g/dL (ref 3.5–5.0)
Alkaline Phosphatase: 50 U/L (ref 38–126)
Anion gap: 10 (ref 5–15)
BUN: 10 mg/dL (ref 8–23)
CO2: 24 mmol/L (ref 22–32)
Calcium: 9 mg/dL (ref 8.9–10.3)
Chloride: 99 mmol/L (ref 98–111)
Creatinine, Ser: 0.84 mg/dL (ref 0.61–1.24)
GFR, Estimated: >60 mL/min (ref >60)
Glucose, Bld: 145 mg/dL — ABNORMAL HIGH (ref 70–99)
Potassium: 4 mmol/L (ref 3.5–5.1)
Sodium: 133 mmol/L — ABNORMAL LOW (ref 135–145)
Total Bilirubin: 0.8 mg/dL (ref 0.0–1.2)
Total Protein: 6.3 g/dL — ABNORMAL LOW (ref 6.5–8.1)

## 2024-04-18 LAB — CBC
HCT: 38.5 % — ABNORMAL LOW (ref 39.0–52.0)
Hemoglobin: 13 g/dL (ref 13.0–17.0)
MCH: 32.3 pg (ref 26.0–34.0)
MCHC: 33.8 g/dL (ref 30.0–36.0)
MCV: 95.5 fL (ref 80.0–100.0)
Platelets: 163 10*3/uL (ref 150–400)
RBC: 4.03 MIL/uL — ABNORMAL LOW (ref 4.22–5.81)
RDW: 12.4 % (ref 11.5–15.5)
WBC: 4.1 10*3/uL (ref 4.0–10.5)
nRBC: 0 % (ref 0.0–0.2)

## 2024-04-18 LAB — URINALYSIS, ROUTINE W REFLEX MICROSCOPIC
Bacteria, UA: NONE SEEN
Bilirubin Urine: NEGATIVE
Glucose, UA: NEGATIVE mg/dL
Hgb urine dipstick: NEGATIVE
Ketones, ur: NEGATIVE mg/dL
Leukocytes,Ua: NEGATIVE
Nitrite: NEGATIVE
Protein, ur: NEGATIVE mg/dL
Specific Gravity, Urine: 1.005 (ref 1.005–1.030)
Squamous Epithelial / HPF: 0 /HPF (ref 0–5)
WBC, UA: 0 WBC/hpf (ref 0–5)
pH: 6 (ref 5.0–8.0)

## 2024-04-18 LAB — TROPONIN I (HIGH SENSITIVITY)
Troponin I (High Sensitivity): 3 ng/L (ref <18)
Troponin I (High Sensitivity): 3 ng/L (ref <18)

## 2024-04-18 MED ORDER — MORPHINE SULFATE (PF) 4 MG/ML IV SOLN
4.0000 mg | Freq: Once | INTRAVENOUS | Status: AC
  Administered 2024-04-18: 4 mg via INTRAVENOUS
  Filled 2024-04-18: qty 1

## 2024-04-18 MED ORDER — SENNOSIDES-DOCUSATE SODIUM 8.6-50 MG PO TABS: 1.0000 | ORAL_TABLET | Freq: Every day | ORAL | 0 refills | Status: DC

## 2024-04-18 MED ORDER — SENNOSIDES-DOCUSATE SODIUM 8.6-50 MG PO TABS
1.0000 | ORAL_TABLET | Freq: Every day | ORAL | 0 refills | Status: AC
Start: 2024-04-18 — End: ?

## 2024-04-18 MED ORDER — SODIUM CHLORIDE 0.9 % IV BOLUS
1000.0000 mL | Freq: Once | INTRAVENOUS | Status: AC
  Administered 2024-04-18: 1000 mL via INTRAVENOUS

## 2024-04-18 MED ORDER — IOHEXOL 300 MG/ML  SOLN
100.0000 mL | Freq: Once | INTRAMUSCULAR | Status: AC | PRN
  Administered 2024-04-18: 100 mL via INTRAVENOUS

## 2024-04-18 NOTE — Discharge Instructions (Signed)
 CT of your abdomen/pelvis was reassuring today with no acute findings of infection or blockages.  You do have a large amount of stool indicating likely constipation contributing to your symptoms.  I have sent medication to your pharmacy to take once daily.  I would also like you to pick up MiraLAX  over-the-counter and take 2-3 capfuls in the morning each day until your stools become softer.  Follow-up with your primary care provider for reassessment.

## 2024-04-18 NOTE — ED Provider Notes (Signed)
 [facility] Provider Note    Event Date/Time   First MD Initiated Contact with Patient 04/18/24 [phone]     (approximate)   History   Dizziness   HPI Matthew Newman is a 78 y.o. male presenting today for abdominal pain.  Patient states he has had pain in his left lower abdomen which radiates around to the right side.  Was recently in the ED and diagnosed with diverticulitis.  Stated he was put on a medication which he thought was amoxicillin  but is also unsure if he is taking it all as prescribed.  Has had occasional lightheadedness.  Also having constipation with hard stools.  Denies nausea, vomiting, chest pain, shortness of breath, leg pain, leg swelling, dysuria.  Chart review: Patient seen on 04/10/2024 for acute left lower quadrant pain after he was diagnosed with diverticulitis and started on Augmentin .  CT at that time showed no other acute findings with continued diverticulitis.      Physical Exam   Triage Vital Signs: ED Triage Vitals [04/18/24 0947]  Encounter Vitals Group     BP 116/66     Systolic BP Percentile      Diastolic BP Percentile      Pulse Rate 64     Resp 14     Temp 98.6 F (37 C)     Temp Source Oral     SpO2 99 %     Weight      Height      Head Circumference      Peak Flow      Pain Score 7     Pain Loc      Pain Education      Exclude from Growth Chart     Most recent vital signs: Vitals:   04/18/24 0947  BP: 116/66  Pulse: 64  Resp: 14  Temp: 98.6 F (37 C)  SpO2: 99%   Physical Exam: I have reviewed the vital signs and nursing notes. General: Awake, alert, no acute distress.  Nontoxic appearing. Head:  Atraumatic, normocephalic.   ENT:  EOM intact, PERRL. Oral mucosa is pink and moist with no lesions. Neck: Neck is supple with full range of motion, No meningeal signs. Cardiovascular:  RRR, No murmurs. Peripheral pulses palpable and equal bilaterally. Respiratory:  Symmetrical chest wall expansion.   No rhonchi, rales, or wheezes.  Good air movement throughout.  No use of accessory muscles.   Musculoskeletal:  No cyanosis or edema. Moving extremities with full ROM Abdomen:  Soft, tenderness to palpation most prominent in left lower quadrant with mild pain in the left upper and right lower, nondistended. Neuro:  GCS 15, moving all four extremities, interacting appropriately. Speech clear. Psych:  Calm, appropriate.   Skin:  Warm, dry, no rash.    ED Results / Procedures / Treatments   Labs (all labs ordered are listed, but only abnormal results are displayed) Labs Reviewed  COMPREHENSIVE METABOLIC PANEL WITH GFR - Abnormal; Notable for the following components:      Result Value   Sodium 133 (*)    Glucose, Bld 145 (*)    Total Protein 6.3 (*)    All other components within normal limits  CBC - Abnormal; Notable for the following components:   RBC 4.03 (*)    HCT 38.5 (*)    All other components within normal limits  URINALYSIS, ROUTINE W REFLEX MICROSCOPIC - Abnormal; Notable for the following components:   Color, Urine YELLOW (*)  APPearance CLEAR (*)    All other components within normal limits  CBG MONITORING, ED  TROPONIN I (HIGH SENSITIVITY)  TROPONIN I (HIGH SENSITIVITY)     EKG    RADIOLOGY Independently interpreted CT abdomen/pelvis with no acute pathology   PROCEDURES:  Critical Care performed: No  Procedures   MEDICATIONS ORDERED IN ED: Medications  sodium chloride  0.9 % bolus 1,000 mL (1,000 mLs Intravenous New Bag/Given 04/18/24 1137)  morphine  (PF) 4 MG/ML injection 4 mg (4 mg Intravenous Given 04/18/24 1137)  iohexol  (OMNIPAQUE ) 300 MG/ML solution 100 mL (100 mLs Intravenous Contrast Given 04/18/24 1214)     IMPRESSION / MDM / ASSESSMENT AND PLAN / ED COURSE  I reviewed the triage vital signs and the nursing notes.                              Differential diagnosis includes, but is not limited to, diverticulitis, perforation, abscess,  constipation, SBO, colitis, enteritis  Patient's presentation is most consistent with acute complicated illness / injury requiring diagnostic workup.  Patient is a 78 year old male presenting today for worsening left lower quadrant pain in the setting of recent diverticulitis.  It is difficult to discern on history if he is actually taking the Augmentin  as prescribed.  He does have notable tenderness in the left lower quadrant and scattered tenderness elsewhere.  Will repeat CT imaging to rule out signs of abscess today.  Patient also given morphine  and 1 L fluids.  CBC, CMP, UA largely unremarkable.  CT abdomen/pelvis negative for acute pathology such as diverticulitis or other acute infections or blockages.  Does have evidence of constipation and large stool burden which may be contributing to his symptoms as he does note constipation.  Otherwise reassessed with no ongoing pain symptoms and reassuring workup elsewise.  2 negative troponins.  Will discharge with bowel regimen including Peri-Colace and MiraLAX .  He has follow-up with his primary care provider in 1 week and was given strict return precautions.  The patient is on the cardiac monitor to evaluate for evidence of arrhythmia and/or significant heart rate changes. Clinical Course as of 04/18/24 1321  Sun Apr 18, 2024  1319 No ongoing pain.  CT negative.  Will discharge [DW]    Clinical Course User Index [DW] Kandee Orion, MD     FINAL CLINICAL IMPRESSION(S) / ED DIAGNOSES   Final diagnoses:  Constipation, unspecified constipation type  Left lower quadrant abdominal pain     Rx / DC Orders   ED Discharge Orders          Ordered    senna-docusate (SENOKOT-S) 8.6-50 MG tablet  Daily        04/18/24 1320             Note:  This document was prepared using Dragon voice recognition software and may include unintentional dictation errors.   Kandee Orion, MD 04/18/24 1321

## 2024-04-18 NOTE — ED Triage Notes (Signed)
 Pt states that since last night he's been having dizziness and diaphoresis. Pt denies CP.

## 2024-05-16 ENCOUNTER — Other Ambulatory Visit: Payer: Self-pay

## 2024-05-16 ENCOUNTER — Emergency Department

## 2024-05-16 ENCOUNTER — Emergency Department
Admission: EM | Admit: 2024-05-16 | Discharge: 2024-05-16 | Disposition: A | Discharge status: Home / Self Care | Attending: Emergency Medicine | Admitting: Emergency Medicine

## 2024-05-16 DIAGNOSIS — R1013 Epigastric pain: Secondary | ICD-10-CM | POA: Insufficient documentation

## 2024-05-16 DIAGNOSIS — R109 Unspecified abdominal pain: Secondary | ICD-10-CM

## 2024-05-16 DIAGNOSIS — R61 Generalized hyperhidrosis: Secondary | ICD-10-CM | POA: Insufficient documentation

## 2024-05-16 LAB — HEPATIC FUNCTION PANEL
ALT: 12 U/L (ref 0–44)
AST: 19 U/L (ref 15–41)
Albumin: 3.6 g/dL (ref 3.5–5.0)
Alkaline Phosphatase: 46 U/L (ref 38–126)
Bilirubin, Direct: <0.1 mg/dL (ref 0.0–0.2)
Total Bilirubin: 0.8 mg/dL (ref 0.0–1.2)
Total Protein: 6.2 g/dL — ABNORMAL LOW (ref 6.5–8.1)

## 2024-05-16 LAB — CBC
HCT: 39.5 % (ref 39.0–52.0)
Hemoglobin: 12.8 g/dL — ABNORMAL LOW (ref 13.0–17.0)
MCH: 31.4 pg (ref 26.0–34.0)
MCHC: 32.4 g/dL (ref 30.0–36.0)
MCV: 97.1 fL (ref 80.0–100.0)
Platelets: 175 K/uL (ref 150–400)
RBC: 4.07 MIL/uL — ABNORMAL LOW (ref 4.22–5.81)
RDW: 12.9 % (ref 11.5–15.5)
WBC: 5 K/uL (ref 4.0–10.5)
nRBC: 0 % (ref 0.0–0.2)

## 2024-05-16 LAB — BASIC METABOLIC PANEL WITH GFR
Anion gap: 9 (ref 5–15)
BUN: 12 mg/dL (ref 8–23)
CO2: 26 mmol/L (ref 22–32)
Calcium: 9.1 mg/dL (ref 8.9–10.3)
Chloride: 102 mmol/L (ref 98–111)
Creatinine, Ser: 0.7 mg/dL (ref 0.61–1.24)
GFR, Estimated: >60 mL/min (ref >60)
Glucose, Bld: 117 mg/dL — ABNORMAL HIGH (ref 70–99)
Potassium: 4 mmol/L (ref 3.5–5.1)
Sodium: 137 mmol/L (ref 135–145)

## 2024-05-16 LAB — LIPASE, BLOOD: Lipase: 31 U/L (ref 11–51)

## 2024-05-16 LAB — TROPONIN I (HIGH SENSITIVITY)
Troponin I (High Sensitivity): 3 ng/L (ref <18)
Troponin I (High Sensitivity): <2 ng/L (ref <18)

## 2024-05-16 MED ORDER — SODIUM CHLORIDE 0.9 % IV BOLUS
500.0000 mL | Freq: Once | INTRAVENOUS | Status: AC
  Administered 2024-05-16: 500 mL via INTRAVENOUS

## 2024-05-16 MED ORDER — LIDOCAINE VISCOUS HCL 2 % MT SOLN: 15.0000 mL | Freq: Once | OROMUCOSAL | Status: DC

## 2024-05-16 MED ORDER — LIDOCAINE VISCOUS HCL 2 % MT SOLN
15.0000 mL | Freq: Once | OROMUCOSAL | Status: AC
  Administered 2024-05-16: 15 mL via ORAL
  Filled 2024-05-16: qty 15

## 2024-05-16 MED ORDER — ALUM & MAG HYDROXIDE-SIMETH 200-200-20 MG/5ML PO SUSP
30.0000 mL | Freq: Once | ORAL | Status: AC
  Administered 2024-05-16: 30 mL via ORAL
  Filled 2024-05-16: qty 30

## 2024-05-16 NOTE — ED Notes (Signed)
 [place] at bedside

## 2024-05-16 NOTE — ED Notes (Signed)
 EDP notified of Pt running sinus bradycardia on monitor.

## 2024-05-16 NOTE — ED Triage Notes (Signed)
 Pt comes with c/o cp. Pt states he woke up sweaty and clothes were wet. Pt states left sided cp. Pt states no sob unless he is moving too fast.

## 2024-05-16 NOTE — ED Provider Notes (Signed)
 [facility] Provider Note    Event Date/Time   First MD Initiated Contact with Patient 05/16/24 0830     (approximate)   History   Epigastric pain   HPI  Matthew Newman is a 78 y.o. male who presents to the emergency department today because of concerns for epigastric pain.  It started this morning when he woke up.  Denies any pain when he went to bed.  Had a normal day for him yesterday.  In addition to the pain the patient noticed that he was sweating when he woke up this morning.  Felt like the whole bed was wet.  Denies any fevers.  Denies similar symptoms in the past.  Has been hydrating since he woke up.     Physical Exam   Triage Vital Signs: ED Triage Vitals  Encounter Vitals Group     BP 05/16/24 0826 107/63     Girls Systolic BP Percentile --      Girls Diastolic BP Percentile --      Boys Systolic BP Percentile --      Boys Diastolic BP Percentile --      Pulse Rate 05/16/24 0826 62     Resp 05/16/24 0826 18     Temp 05/16/24 0826 98 F (36.7 C)     Temp src --      SpO2 05/16/24 0826 99 %     Weight 05/16/24 0823 172 lb (78 kg)     Height 05/16/24 0823 6' 3 (1.905 m)     Head Circumference --      Peak Flow --      Pain Score 05/16/24 0823 2     Pain Loc --      Pain Education --      Exclude from Growth Chart --     Most recent vital signs: Vitals:   05/16/24 0826  BP: 107/63  Pulse: 62  Resp: 18  Temp: 98 F (36.7 C)  SpO2: 99%   General: Awake, alert, oriented. CV:  Good peripheral perfusion. Regular rate and rhythm. Resp:  Normal effort. Lungs clear. Abd:  No distention. Minimally tender in the epigastric region.   ED Results / Procedures / Treatments   Labs (all labs ordered are listed, but only abnormal results are displayed) Labs Reviewed  BASIC METABOLIC PANEL WITH GFR - Abnormal; Notable for the following components:      Result Value   Glucose, Bld 117 (*)    All other components within normal  limits  CBC - Abnormal; Notable for the following components:   RBC 4.07 (*)    Hemoglobin 12.8 (*)    All other components within normal limits  HEPATIC FUNCTION PANEL - Abnormal; Notable for the following components:   Total Protein 6.2 (*)    All other components within normal limits  LIPASE, BLOOD  TROPONIN I (HIGH SENSITIVITY)  TROPONIN I (HIGH SENSITIVITY)     EKG  I, Guadalupe Eagles, attending physician, personally viewed and interpreted this EKG  EKG Time: 0826 Rate: 64 Rhythm: normal sinus rhythm Axis: left axis deviation Intervals: qtc 418 QRS: narrow ST changes: no st elevation Impression: abnormal ekg    RADIOLOGY I independently interpreted and visualized the CXR. My interpretation: No pneumonia Radiology interpretation:  IMPRESSION:  1. No acute cardiopulmonary disease.  2. Aortic Atherosclerosis (ICD10-I70.0).    I independently interpreted and visualized the RUQ US . My interpretation: No gallstones Radiology interpretation:  IMPRESSION:  1. No  acute abdominal findings.  Normal gallbladder.  2. Benign hemangioma in the RIGHT hepatic lobe     PROCEDURES:  Critical Care performed: No    MEDICATIONS ORDERED IN ED: Medications - No data to display   IMPRESSION / MDM / ASSESSMENT AND PLAN / ED COURSE  I reviewed the triage vital signs and the nursing notes.                              Differential diagnosis includes, but is not limited to, gastritis, ACS, pancreatitis, hepatitis, gallbladder disease  Patient's presentation is most consistent with acute presentation with potential threat to life or bodily function.   Patient presented to the emergency department today with concerns for epigastric pain.  On exam patient was mildly tender at the epigastric region.  Patient was afebrile.  Blood work without concerning leukocytosis.  Lipase and LFTs were within normal limits.  Did obtain a right upper quadrant ultrasound which did not show any  gallstones.  At this time do think would be reasonable for patient be discharged given reassuring workup. Doubt dissection or PE given clinical history and exam.      FINAL CLINICAL IMPRESSION(S) / ED DIAGNOSES   Final diagnoses:  Abdominal pain, unspecified abdominal location         Note:  This document was prepared using Dragon voice recognition software and may include unintentional dictation errors.    Floy Roberts, MD 05/16/24 [phone]

## 2024-05-16 NOTE — ED Notes (Signed)
 Pt taken to room 26. Primary RN made aware and informed no blood drawn in triage.

## 2024-05-16 NOTE — ED Notes (Signed)
 Responded to call bell and provided Pt. with urinal.

## 2024-08-16 ENCOUNTER — Other Ambulatory Visit: Payer: Self-pay

## 2024-08-16 ENCOUNTER — Emergency Department

## 2024-08-16 ENCOUNTER — Emergency Department
Admission: EM | Admit: 2024-08-16 | Discharge: 2024-08-16 | Disposition: A | Discharge status: Home / Self Care | Attending: Emergency Medicine | Admitting: Emergency Medicine

## 2024-08-16 ENCOUNTER — Encounter: Payer: Self-pay | Admitting: Emergency Medicine

## 2024-08-16 DIAGNOSIS — R0602 Shortness of breath: Secondary | ICD-10-CM | POA: Diagnosis not present

## 2024-08-16 DIAGNOSIS — R0789 Other chest pain: Secondary | ICD-10-CM | POA: Insufficient documentation

## 2024-08-16 LAB — CBC
HCT: 39.9 % (ref 39.0–52.0)
Hemoglobin: 13.3 g/dL (ref 13.0–17.0)
MCH: 32 pg (ref 26.0–34.0)
MCHC: 33.3 g/dL (ref 30.0–36.0)
MCV: 95.9 fL (ref 80.0–100.0)
Platelets: 186 K/uL (ref 150–400)
RBC: 4.16 MIL/uL — ABNORMAL LOW (ref 4.22–5.81)
RDW: 12.5 % (ref 11.5–15.5)
WBC: 3.7 K/uL — ABNORMAL LOW (ref 4.0–10.5)
nRBC: 0 % (ref 0.0–0.2)

## 2024-08-16 LAB — BASIC METABOLIC PANEL WITH GFR
Anion gap: 13 (ref 5–15)
BUN: 8 mg/dL (ref 8–23)
CO2: 28 mmol/L (ref 22–32)
Calcium: 9.3 mg/dL (ref 8.9–10.3)
Chloride: 99 mmol/L (ref 98–111)
Creatinine, Ser: 0.88 mg/dL (ref 0.61–1.24)
GFR, Estimated: >60 mL/min (ref >60)
Glucose, Bld: 140 mg/dL — ABNORMAL HIGH (ref 70–99)
Potassium: 3.9 mmol/L (ref 3.5–5.1)
Sodium: 140 mmol/L (ref 135–145)

## 2024-08-16 LAB — TROPONIN I (HIGH SENSITIVITY): Troponin I (High Sensitivity): 4 ng/L

## 2024-08-16 NOTE — ED Provider Notes (Signed)
 [facility] Provider Note   Event Date/Time   First MD Initiated Contact with Patient 08/16/24 [phone]     (approximate) History  Chest Pain  HPI Matthew Newman is a 78 y.o. male with a past medical history of stomach ulcers, diverticulosis, IBS, and GERD who presents complaining of of lower anterior chest wall pain that patient states woke him from sleep and has been stable since onset.  Patient states that this pain radiates into the left chest although has no exacerbating or relieving factors.  Patient endorses associated shortness of breath.  Patient denies any history of sleep apnea or using a CPAP machine.  Patient states that he has had similar episodes in the past but is not presented to the emergency department or any other physician for the symptoms.  Patient denies taking any medications prior to arrival.  Patient does endorse a history of seeing a cardiologist in the past however does not know whether he was diagnosed with anything or what tests were done. ROS: Patient currently denies any vision changes, tinnitus, difficulty speaking, facial droop, sore throat, abdominal pain, nausea/vomiting/diarrhea, dysuria, or weakness/numbness/paresthesias in any extremity   Physical Exam  Triage Vital Signs: ED Triage Vitals  Encounter Vitals Group     BP 08/16/24 0656 111/75     Girls Systolic BP Percentile --      Girls Diastolic BP Percentile --      Boys Systolic BP Percentile --      Boys Diastolic BP Percentile --      Pulse Rate 08/16/24 0656 78     Resp 08/16/24 0656 18     Temp 08/16/24 0700 98.1 F (36.7 C)     Temp Source 08/16/24 0700 Oral     SpO2 08/16/24 0656 99 %     Weight 08/16/24 0657 171 lb 15.3 oz (78 kg)     Height 08/16/24 0657 6' 3 (1.905 m)     Head Circumference --      Peak Flow --      Pain Score 08/16/24 0656 3     Pain Loc --      Pain Education --      Exclude from Growth Chart --    Most recent vital signs: Vitals:    08/16/24 0656 08/16/24 0700  BP: 111/75   Pulse: 78   Resp: 18   Temp:  98.1 F (36.7 C)  SpO2: 99%    General: Awake, oriented x4. CV:  Good peripheral perfusion.  No MGR Resp:  Normal effort.  CTAB Abd:  No distention. Other:  Elderly well-developed, well-nourished Caucasian male resting comfortably in no acute distress.  Tenderness to palpation over the left anterior lower rib cage at the lower costal margin. ED Results / Procedures / Treatments  Labs (all labs ordered are listed, but only abnormal results are displayed) Labs Reviewed  BASIC METABOLIC PANEL WITH GFR - Abnormal; Notable for the following components:      Result Value   Glucose, Bld 140 (*)    All other components within normal limits  CBC - Abnormal; Notable for the following components:   WBC 3.7 (*)    RBC 4.16 (*)    All other components within normal limits  TROPONIN I (HIGH SENSITIVITY)   EKG ED ECG REPORT I, Artist MARLA Kerns, the attending physician, personally viewed and interpreted this ECG. Date: 08/16/2024 EKG Time: 0657 Rate: 66 Rhythm: normal sinus rhythm QRS Axis: normal Intervals: normal ST/T  Wave abnormalities: normal Narrative Interpretation: no evidence of acute ischemia RADIOLOGY ED MD interpretation: 2 view chest x-ray interpreted by me shows no evidence of acute abnormalities including no pneumonia, pneumothorax, or widened mediastinum - All radiology independently interpreted and agree with radiology assessment Official radiology report(s): DG Chest 2 View Result Date: 08/16/2024 EXAM: 2 VIEW(S) XRAY OF THE CHEST 08/16/2024 07:28:00 AM COMPARISON: 05/16/2024 CLINICAL HISTORY: chest pain. Patient complains of left sided, chest pain/ pressure that woke him up out of his sleep approx 1 hour PTA. Pt endorses shortness of breath. Denies nausea, vomiting FINDINGS: LUNGS AND PLEURA: Flattened diaphragms and mild-to-moderate hyperinflation, unchanged. No focal pulmonary opacity. No pulmonary  edema. No pleural effusion. No pneumothorax. HEART AND MEDIASTINUM: Mild aortic arch calcification. No acute abnormality of the cardiac and mediastinal silhouettes. BONES AND SOFT TISSUES: Mild-to-moderate multilevel thoracic spine degenerative disc changes. No acute osseous abnormality. IMPRESSION: 1. No acute cardiopulmonary process. 2. Mild aortic arch calcification. Electronically signed by: Waddell Calk MD 08/16/2024 07:32 AM EDT RP Workstation: [id number]   PROCEDURES: Critical Care performed: No Procedures MEDICATIONS ORDERED IN ED: Medications - No data to display IMPRESSION / MDM / ASSESSMENT AND PLAN / ED COURSE  I reviewed the triage vital signs and the nursing notes.                             The patient is on the cardiac monitor to evaluate for evidence of arrhythmia and/or significant heart rate changes. Patient's presentation is most consistent with acute presentation with potential threat to life or bodily function. Patient is a 78 year old male with the above-stated past medical history that presents for lower anterior chest wall pain with associated shortness of breath that is tender to palpation. DDx: ACS, aortic dissection, pneumonia, pneumothorax, gastritis, esophagitis Plan: CBC, BMP, troponin, chest x-ray, EKG Pertinent positives: WBC 3.7 Clinical Course as of 08/16/24 0806  Mon Aug 16, 2024  0805 Patient been reassessed and is ready for discharge at this time.  Patient's troponin is negative x 1.  EKG is nonischemic.  Chest x-ray shows no evidence of acute abnormalities.  Patient's pain is reproducible on palpation to the left anterior lower sternal border.  Patient encouraged to use ibuprofen/Tylenol/naproxen for any continued pain in this region as well as follow-up with his primary care physician if pain does not resolve.  Patient agrees with plan, strict turn precautions given, and all questions answered.  Dispo: Discharge home with PCP follow-up [EB]     Clinical Course User Index [EB] Jossie Artist POUR, MD   FINAL CLINICAL IMPRESSION(S) / ED DIAGNOSES   Final diagnoses:  Left-sided chest wall pain  Shortness of breath   Rx / DC Orders   ED Discharge Orders     None      Note:  This document was prepared using Dragon voice recognition software and may include unintentional dictation errors.   Jossie Artist POUR, MD 08/16/24 [phone]

## 2024-08-16 NOTE — ED Triage Notes (Signed)
 Pt presented to ED with c/o left sided, chest pain/ pressure that woke him up out of his sleep approx 1 hour PTA. Pt endorses shortness of breath. Denies nausea, vomiting. Denies thinners

## 2024-09-14 ENCOUNTER — Ambulatory Visit
Admission: EM | Admit: 2024-09-14 | Discharge: 2024-09-14 | Disposition: A | Discharge status: Home / Self Care | Attending: Emergency Medicine | Admitting: Emergency Medicine

## 2024-09-14 DIAGNOSIS — R21 Rash and other nonspecific skin eruption: Secondary | ICD-10-CM

## 2024-09-14 MED ORDER — CEPHALEXIN 500 MG PO CAPS: 500.0000 mg | ORAL_CAPSULE | Freq: Three times a day (TID) | ORAL | 0 refills | Status: DC

## 2024-09-14 NOTE — ED Provider Notes (Signed)
 Matthew Newman    CSN: [id number] Arrival date & time: 09/14/24  0827      History   Chief Complaint Chief Complaint  Patient presents with   Rash    HPI Matthew Newman is a 78 y.o. male.   Patient presents for evaluation of erythematous rash present around the nose beginning 3 days ago.  Endorses that it is worsening and spreading.  Denies pruritus, drainage, pain.  Has attempted 100% alcohol and hydrocortisone, unsure if effective.  Denies changes in toiletries diet medicines, no sick contact with similar symptoms.  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,3-galactose    Arthritis    Back problem    L4 L5   Bronchitis    Colon polyp 2014   Diverticulitis 2014   Diverticulosis    GERD (gastroesophageal reflux disease)    Hemorrhoid    Hiatal hernia    IBS (irritable bowel syndrome)    Kidney stone    Pneumonia    Sinus problem    Ulcer of the stomach and intestine 2005    Patient Active Problem List   Diagnosis Date Noted   Notalgia 12/24/2014   Generalized abdominal pain 12/24/2014    Past Surgical History:  Procedure Laterality Date   [facility]   ESOPHAGEAL DILATION     [facility]   [facility]        Home Medications    Prior to Admission medications   Medication Sig Start Date End Date Taking? Authorizing Provider  cephALEXin  (KEFLEX ) 500 MG capsule Take 1 capsule (500 mg total) by mouth 3 (three) times daily for 5 days. 09/14/24 09/19/24 Yes Jauna Raczynski R, NP  clotrimazole  (LOTRIMIN ) 1 % cream Apply 1 Application topically 2 (two) times daily. 04/07/24   Arlander Charleston, MD  docusate sodium  (COLACE) 100 MG capsule Take 1 capsule (100 mg total) by mouth 2 (two) times daily. 12/14/23 12/13/24  Arlander Charleston, MD  FINASTERIDE PO Take by mouth daily.    [provider]  lansoprazole (PREVACID) 15 MG capsule Take 15 mg by mouth daily at 12 noon.     [provider]  nystatin  cream (MYCOSTATIN ) Apply to affected area 2 times daily 03/28/24   Corlis Burnard DEL, NP  omeprazole  (PRILOSEC  OTC) 20 MG tablet Take 1 tablet (20 mg total) by mouth daily. 12/12/23 02/10/24  Suzanne Kirsch, MD  polyethylene glycol (MIRALAX ) 17 g packet Take 17 g by mouth daily. 12/14/23   Arlander Charleston, MD  senna-docusate (SENOKOT-S) 8.6-50 MG tablet Take 1 tablet by mouth daily. 04/18/24   Malvina Alm DASEN, MD    Family History History reviewed. No pertinent family history.  Social History Social History   Tobacco Use   Smoking status: Some Days    Current packs/day: 0.00    Types: Cigarettes    Last attempt to quit: 11/12/1983    Years since quitting: 40.8  Substance Use Topics   Alcohol use: No    Alcohol/week: 0.0 standard drinks of alcohol   Drug use: No     Allergies   Alpha-gal, Alpha-d-galactosidase, Aspirin , Barley grass, Cetirizine, Corn oil, Doxycycline, Ibuprofen, Lactose, Lactose intolerance (gi), Peanut-containing drug products, Simvastatin, and Shellfish allergy   Review of Systems Review of Systems  Skin:  Positive for rash.     Physical Exam Triage Vital Signs ED Triage Vitals  Encounter Vitals Group  BP 09/14/24 0844 128/66     Girls Systolic BP Percentile --      Girls Diastolic BP Percentile --      Boys Systolic BP Percentile --      Boys Diastolic BP Percentile --      Pulse Rate 09/14/24 0844 (!) 59     Resp 09/14/24 0844 18     Temp 09/14/24 0844 97.8 F (36.6 C)     Temp Source 09/14/24 0844 Oral     SpO2 09/14/24 0844 98 %     Weight --      Height --      Head Circumference --      Peak Flow --      Pain Score 09/14/24 0846 0     Pain Loc --      Pain Education --      Exclude from Growth Chart --    No data found.  Updated Vital Signs BP 128/66 (BP Location: Left Arm)   Pulse (!) 59   Temp 97.8 F (36.6 C) (Oral)   Resp 18   SpO2 98%   Visual Acuity Right Eye Distance:   Left Eye Distance:    Bilateral Distance:    Right Eye Near:   Left Eye Near:    Bilateral Near:     Physical Exam Constitutional:      Appearance: Normal appearance.  Eyes:     Extraocular Movements: Extraocular movements intact.  Pulmonary:     Effort: Pulmonary effort is normal.  Skin:    Comments: Erythema underneath the nose with crusting throughout his mustache, no drainage noted  Neurological:     Mental Status: He is alert and oriented to person, place, and time.      UC Treatments / Results  Labs (all labs ordered are listed, but only abnormal results are displayed) Labs Reviewed - No data to display  EKG   Radiology No results found.  Procedures Procedures (including critical care time)  Medications Ordered in UC Medications - No data to display  Initial Impression / Assessment and Plan / UC Course  I have reviewed the triage vital signs and the nursing notes.  Pertinent labs & imaging results that were available during my care of the patient were reviewed by me and considered in my medical decision making (see chart for details).  Rash  Presentation concerning for infection as there is significant crusting at the base of the hair follicle, placed on cephalexin , recommended nonpharmacological supportive care and advised follow-up with primary doctor for reevaluation if symptoms persist Final Clinical Impressions(s) / UC Diagnoses   Final diagnoses:  Rash     Discharge Instructions      Today you are evaluated for your rash and on exam there is redness and crusting throughout your mustache which is concerning for infection and you have been started on antibiotic  Take cephalexin  every 8 hours for 5 days  Clean the area with soap and water during normal hygiene  If the area becomes itchy or painful you may continue use of cortisone cream  If symptoms do not fully clear up or worsen please follow-up with your primary doctor or the urgent care for reevaluate   ED  Prescriptions     Medication Sig Dispense Auth. Provider   cephALEXin  (KEFLEX ) 500 MG capsule Take 1 capsule (500 mg total) by mouth 3 (three) times daily for 5 days. 15 capsule Alilah Mcmeans R, NP      PDMP  not reviewed this encounter.   Matthew Newman, [place] 09/14/24 [phone]

## 2024-09-14 NOTE — ED Triage Notes (Signed)
 Pt c/o rash on face around nose x 3 days. Tried  hydrocortisone cream with no relief of symptoms.

## 2024-09-14 NOTE — Discharge Instructions (Signed)
 Today you are evaluated for your rash and on exam there is redness and crusting throughout your mustache which is concerning for infection and you have been started on antibiotic  Take cephalexin  every 8 hours for 5 days  Clean the area with soap and water during normal hygiene  If the area becomes itchy or painful you may continue use of cortisone cream  If symptoms do not fully clear up or worsen please follow-up with your primary doctor or the urgent care for reevaluate

## 2024-09-17 ENCOUNTER — Ambulatory Visit
Admission: EM | Admit: 2024-09-17 | Discharge: 2024-09-17 | Disposition: A | Discharge status: Home / Self Care | Attending: Emergency Medicine | Admitting: Emergency Medicine

## 2024-09-17 ENCOUNTER — Ambulatory Visit (INDEPENDENT_AMBULATORY_CARE_PROVIDER_SITE_OTHER)

## 2024-09-17 DIAGNOSIS — M25532 Pain in left wrist: Secondary | ICD-10-CM

## 2024-09-17 DIAGNOSIS — M25531 Pain in right wrist: Secondary | ICD-10-CM | POA: Diagnosis not present

## 2024-09-17 DIAGNOSIS — R21 Rash and other nonspecific skin eruption: Secondary | ICD-10-CM | POA: Diagnosis not present

## 2024-09-17 MED ORDER — CEPHALEXIN 500 MG PO CAPS: 500.0000 mg | ORAL_CAPSULE | Freq: Three times a day (TID) | ORAL | 0 refills | Status: AC

## 2024-09-17 MED ORDER — DICLOFENAC SODIUM 1 % EX GEL: 2.0000 g | Freq: Four times a day (QID) | CUTANEOUS | 0 refills | Status: AC

## 2024-09-17 MED ORDER — TRIAMCINOLONE ACETONIDE 0.1 % EX CREA: 1.0000 | TOPICAL_CREAM | Freq: Two times a day (BID) | CUTANEOUS | 0 refills | Status: DC

## 2024-09-17 NOTE — ED Triage Notes (Signed)
 Patient presents to [facility] for follow-up for rash on face and bilateral wrist pain from falling and landing on his wrist. He states he was seen 11/4 for rash and prescribed antibiotics. He states he quit taking the medication a couple days ago.

## 2024-09-17 NOTE — Discharge Instructions (Addendum)
 Today you are evaluated for your rash and wrist, as you did not complete treatment we will restart the antibiotic  Take cephalexin  every 8 hours for 5 days for coverage of bacteria that is possibly causing your rash to worsen  Apply triamcinolone cream twice daily over the affected area which helps to reduce inflammation  Clean skin with unscented soap and water at least once a day during her normal hygiene, pat and do not rub  May follow-up with urgent care if your symptoms do not improve  X-rays have been taken of your wrist and you will be notified of results via the telephone  You may apply Voltaren cream over the area as needed throughout the day to help with soreness  You may apply ice pack or heating pad over the area in 10-minute intervals  If there is a break in the bone then you will follow-up with a orthopedic specialist, phone number is on front page  If there is no break in the bone then your symptoms should gradually improve with time

## 2024-09-17 NOTE — ED Provider Notes (Signed)
 [facility]    CSN: [id number] Arrival date & time: 09/17/24  1023      History   Chief Complaint Chief Complaint  Patient presents with   Wrist Pain   Rash    HPI Matthew Newman is a 78 y.o. male.   Patient presents for evaluation of a persisting worsening rash present to the face beginning 7 days ago.  Erythematous but denies pain or drainage, seen in this urgent care on 09/14/2024 by this provider for same symptoms.  Initially only underneath the nose but now has red to the eyebrows.  Was prescribed antibiotics during that visit but endorses he stopped taking medicine after a few doses.  Patient experiencing bilateral wrist pain beginning within the hour due to fall.  Tripped walking into the clinic, uses cane at baseline.  Still with palms down.  Pain is described as a soreness.  Denies numbness or tingling.  Has full range of motion.   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,3-galactose    Arthritis    Back problem    L4 L5   Bronchitis    Colon polyp 2014   Diverticulitis 2014   Diverticulosis    GERD (gastroesophageal reflux disease)    Hemorrhoid    Hiatal hernia    IBS (irritable bowel syndrome)    Kidney stone    Pneumonia    Sinus problem    Ulcer of the stomach and intestine 2005    Patient Active Problem List   Diagnosis Date Noted   Notalgia 12/24/2014   Generalized abdominal pain 12/24/2014    Past Surgical History:  Procedure Laterality Date   [facility]   ESOPHAGEAL DILATION     [facility]   [facility]ilateral [facility]        Home Medications    Prior to Admission medications   Medication Sig Start Date End Date Taking? Authorizing Provider  diclofenac Sodium (VOLTAREN) 1 % GEL Apply 2 g topically 4 (four) times daily. 09/17/24  Yes Anabelle Bungert R, NP  triamcinolone cream (KENALOG) 0.1 % Apply 1 Application topically 2 (two) times daily. 09/17/24  Yes  Krystyne Tewksbury R, NP  cephALEXin  (KEFLEX ) 500 MG capsule Take 1 capsule (500 mg total) by mouth 3 (three) times daily for 5 days. 09/17/24 09/22/24  Teresa Shelba SAUNDERS, NP  clotrimazole  (LOTRIMIN ) 1 % cream Apply 1 Application topically 2 (two) times daily. 04/07/24   Arlander Charleston, MD  docusate sodium  (COLACE) 100 MG capsule Take 1 capsule (100 mg total) by mouth 2 (two) times daily. 12/14/23 12/13/24  Arlander Charleston, MD  FINASTERIDE PO Take by mouth daily.    [provider]  lansoprazole (PREVACID) 15 MG capsule Take 15 mg by mouth daily at 12 noon.    [provider]  nystatin  cream (MYCOSTATIN ) Apply to affected area 2 times daily 03/28/24   Corlis Burnard DEL, NP  omeprazole  (PRILOSEC  OTC) 20 MG tablet Take 1 tablet (20 mg total) by mouth daily. 12/12/23 02/10/24  Suzanne Kirsch, MD  polyethylene glycol (MIRALAX ) 17 g packet Take 17 g by mouth daily. 12/14/23   Arlander Charleston, MD  senna-docusate (SENOKOT-S) 8.6-50 MG tablet Take 1 tablet by mouth daily. 04/18/24   Malvina Alm DASEN, MD    Family History History reviewed. No pertinent family history.  Social History Social History   Tobacco Use   Smoking status: Some  Days    Current packs/day: 0.00    Types: Cigarettes    Last attempt to quit: 11/12/1983    Years since quitting: 40.8  Substance Use Topics   Alcohol use: No    Alcohol/week: 0.0 standard drinks of alcohol   Drug use: No     Allergies   Alpha-gal, Alpha-d-galactosidase, Aspirin , Barley grass, Cetirizine, Corn oil, Doxycycline, Ibuprofen, Lactose, Lactose intolerance (gi), Peanut-containing drug products, Simvastatin, Tilactase, Valacyclovir, Wheat, and Shellfish allergy   Review of Systems Review of Systems  Skin:  Positive for rash.     Physical Exam Triage Vital Signs ED Triage Vitals  Encounter Vitals Group     BP 09/17/24 1123 (!) 152/81     Girls Systolic BP Percentile --      Girls Diastolic BP Percentile --      Boys Systolic BP Percentile --       Boys Diastolic BP Percentile --      Pulse Rate 09/17/24 1123 60     Resp 09/17/24 1123 20     Temp 09/17/24 1123 97.9 F (36.6 C)     Temp Source 09/17/24 1123 Temporal     SpO2 09/17/24 1123 98 %     Weight --      Height --      Head Circumference --      Peak Flow --      Pain Score 09/17/24 1128 4     Pain Loc --      Pain Education --      Exclude from Growth Chart --    No data found.  Updated Vital Signs BP (!) 152/81 (BP Location: Left Arm)   Pulse 60   Temp 97.9 F (36.6 C) (Temporal)   Resp 20   SpO2 98%   Visual Acuity Right Eye Distance:   Left Eye Distance:   Bilateral Distance:    Right Eye Near:   Left Eye Near:    Bilateral Near:     Physical Exam Constitutional:      Appearance: Normal appearance.  Eyes:     Extraocular Movements: Extraocular movements intact.  Musculoskeletal:     Comments: Generalized tenderness to the bilateral wrist without ecchymosis swelling or deformity, 2+ radial pulses, sensation intact, grip 4 out of 5 bilaterally  Skin:    Comments: Erythematous rash present to the eyebrows and underneath the nose within the mustache, dry flaking crust scattered throughout, no drainage noted  Neurological:     Mental Status: He is alert and oriented to person, place, and time. Mental status is at baseline.      UC Treatments / Results  Labs (all labs ordered are listed, but only abnormal results are displayed) Labs Reviewed - No data to display  EKG   Radiology DG Wrist Complete Right Result Date: 09/17/2024 EXAM: 3 OR MORE VIEW(S) XRAY OF THE WRIST 09/17/2024 12:36:22 PM COMPARISON: None available. CLINICAL HISTORY: fall FINDINGS: BONES AND JOINTS: No acute fracture. There is a well-corticated ossific density along the dorsum of the carpal bones on the lateral projection radiograph compatible with sequela of remote triquetral injury. No joint dislocation. SOFT TISSUES: Vascular calcifications. IMPRESSION: 1. No acute fracture  or dislocation. 2. Sequela of remote triquetral injury. Electronically signed by: Waddell Calk MD 09/17/2024 02:06 PM EST RP Workstation: [id number]   DG Wrist Complete Left Result Date: 09/17/2024 EXAM: 3 OR MORE VIEW(S) XRAY OF THE LEFT WRIST 09/17/2024 12:36:49 PM COMPARISON: None available. CLINICAL HISTORY: fall FINDINGS: BONES  AND JOINTS: No acute fracture. No focal osseous lesion. No joint dislocation. Mild radiocarpal joint space narrowing. SOFT TISSUES: The soft tissues are unremarkable. IMPRESSION: 1. No acute fracture or dislocation. Electronically signed by: Waddell Calk MD 09/17/2024 02:04 PM EST RP Workstation: [id number]    Procedures Procedures (including critical care time)  Medications Ordered in UC Medications - No data to display  Initial Impression / Assessment and Plan / UC Course  I have reviewed the triage vital signs and the nursing notes.  Pertinent labs & imaging results that were available during my care of the patient were reviewed by me and considered in my medical decision making (see chart for details).  Acute pain of both wrist, Rash  Antibiotic course on completed due to unknown reason, therefore represcribed to determine if truly effective, additionally prescribed topical steroid cream, discussed administration, advise daily cleansing and monitoring and to return if symptoms do not improve  X-ray negative, to report to patient via telephone, prescribed Voltaren gel and advised supportive care through Tylenol, heat with activity as tolerable Final Clinical Impressions(s) / UC Diagnoses   Final diagnoses:  Acute pain of both wrists  Rash     Discharge Instructions      Today you are evaluated for your rash and wrist, as you did not complete treatment we will restart the antibiotic  Take cephalexin  every 8 hours for 5 days for coverage of bacteria that is possibly causing your rash to worsen  Apply triamcinolone cream twice daily over the  affected area which helps to reduce inflammation  Clean skin with unscented soap and water at least once a day during her normal hygiene, pat and do not rub  May follow-up with urgent care if your symptoms do not improve  X-rays have been taken of your wrist and you will be notified of results via the telephone  You may apply Voltaren cream over the area as needed throughout the day to help with soreness  You may apply ice pack or heating pad over the area in 10-minute intervals  If there is a break in the bone then you will follow-up with a orthopedic specialist, phone number is on front page  If there is no break in the bone then your symptoms should gradually improve with time    ED Prescriptions     Medication Sig Dispense Auth. Provider   triamcinolone cream (KENALOG) 0.1 % Apply 1 Application topically 2 (two) times daily. 30 g Mercer Peifer R, NP   diclofenac Sodium (VOLTAREN) 1 % GEL Apply 2 g topically 4 (four) times daily. 120 g Anh Mangano R, NP   cephALEXin  (KEFLEX ) 500 MG capsule Take 1 capsule (500 mg total) by mouth 3 (three) times daily for 5 days. 15 capsule Chistina Roston R, NP      PDMP not reviewed this encounter.   Teresa Shelba SAUNDERS, NP 09/17/24 1415

## 2024-09-18 ENCOUNTER — Telehealth: Payer: Self-pay | Admitting: Emergency Medicine

## 2024-09-18 NOTE — Telephone Encounter (Signed)
 Patient return to clinic after hours banging on door that he is now experiencing throbbing pain to the left wrist.  Endorses that he believes he picked up medication from pharmacy but unsure if he is using topical cream as patient is a poor historian, wrapped with Ace bandage and discussed x-ray results in person, discussed with patient that soreness may persist for few days as injury just occurred, advised to return to clinic if pain not improving

## 2024-09-21 ENCOUNTER — Ambulatory Visit (HOSPITAL_COMMUNITY): Payer: Self-pay

## 2024-09-22 ENCOUNTER — Telehealth: Payer: Self-pay | Admitting: Emergency Medicine

## 2024-09-22 NOTE — Telephone Encounter (Signed)
 Due to a concern regarding memory and safety of the patient as he has been driving, his sister Tilton who reports she is POA has been notified of current condition and repeated visits.  Was given Phyllis's phone number by the patient who endorsed initially that she was his niece but then later endorsed she was his sister.  Mr. Gahan has come to the urgent care 6 times within the past week, being evaluated on 09/14/2024 and 09/17/2024.  Initially being evaluated for facial rash, was prescribed oral antibiotics, unsure if he received medicine from pharmacy, at visit on 09/17/2024 he endorsed to the nurse that he was having issues with the medicine and he came to the clinic on Wednesday or Thursday but received no help.  While walking to the building on11/05/2024 patient sustained a fall injuring the wrist, x-rays completed which were negative and he was prescribed Voltaren gel for treatment.  At that time patient was unsure of why he came to the clinic and where he had been early that day. Clinic closed early at 2 PM due to construction, patient returned after hours in the early evening beginning on clinic doors and endorsing that his wrist was throbbing, topical Voltaren gel was applied by this provider and he was given a Ace bandage and instructed to rest and apply ice.  Return to clinic on Friday, 09/18/2024, offering staff $20 which was not taken, patient endorsing he did not need to be seen but was unsure where he had been that morning and why he return to clinic.  Patient has come into clinic twice today 09/22/2024.  Unable to tell staff why he needs to be seen and where he has been this morning. Patient was instructed to go home and wait for his sister to call him and check on him.  Patient has returned to clinic since then, offering staff fruit eyeglasses wipe and Aquaphor.

## 2024-10-10 ENCOUNTER — Emergency Department
Admission: EM | Admit: 2024-10-10 | Discharge: 2024-10-10 | Disposition: A | Discharge status: Home / Self Care | Attending: Emergency Medicine | Admitting: Emergency Medicine

## 2024-10-10 ENCOUNTER — Other Ambulatory Visit: Payer: Self-pay

## 2024-10-10 DIAGNOSIS — L209 Atopic dermatitis, unspecified: Secondary | ICD-10-CM | POA: Insufficient documentation

## 2024-10-10 MED ORDER — CLOBETASOL PROPIONATE 0.05 % EX LOTN: 1.0000 | TOPICAL_LOTION | Freq: Two times a day (BID) | CUTANEOUS | 0 refills | Status: AC

## 2024-10-10 NOTE — Discharge Instructions (Signed)
 In addition to the prescribed lotion, use Aquaphor on the skin in between application.  You need to be evaluated by a skin specialist. Please request an appointment with Dermatology when you see primary care.

## 2024-10-10 NOTE — ED Provider Notes (Signed)
   [facility] Provider Note    Event Date/Time   First MD Initiated Contact with Patient 10/10/24 1212     (approximate)   History   Rash   HPI  Matthew Newman is a 78 y.o. male with history of GERD, IBS, diverticulitis and as listed in EMR presents to the emergency department for treatment and evaluation of rash to his face that has been present for several months.  He has used ketoconazole and taken vitamins without any relief.  Area seems like it is migrating downward from between his eyes. No drainage. Area itches and is tender.   Physical Exam    Vitals:   10/10/24 1201  BP: 131/89  Pulse: 65  Resp: 18  Temp: 97.8 F (36.6 C)  SpO2: 100%    General: Awake, no distress.  CV:  Good peripheral perfusion.  Resp:  Normal effort.  Abd:  No distention.  Other:  Erythema between eyes, along skin folds on both sides of nose and across upper lip, down along skin folds of face around mouth and into beard. Scaly skin noted in these areas as well.    ED Results / Procedures / Treatments   Labs (all labs ordered are listed, but only abnormal results are displayed)  Labs Reviewed - No data to display   EKG  Not indicated.   RADIOLOGY  Image and radiology report reviewed and interpreted by me. Radiology report consistent with the same.  Not indicated.  PROCEDURES:  Critical Care performed: No  Procedures   MEDICATIONS ORDERED IN ED:  Medications - No data to display   IMPRESSION / MDM / ASSESSMENT AND PLAN / ED COURSE   I have reviewed the triage note and vital signs. Vital signs stable   Differential diagnosis includes, but is not limited to, atopic dermatitis, psoriasis  Patient's presentation is most consistent with acute illness / injury with system symptoms.  78 year old male presents to the emergency department for evaluation of red area to the face that seems to be spreading downward from his eyebrows under his  beard.  He has been treated in the past with ketoconazole without any relief.  Is unclear if he has been using the medication as prescribed.  Today, exam is most consistent with an atopic dermatitis and will be treated with clobetasol.  He was strongly advised to call his primary care provider for follow-up appointment and request referral to dermatology.  He is agreeable to this plan.    FINAL CLINICAL IMPRESSION(S) / ED DIAGNOSES   Final diagnoses:  Atopic dermatitis of face     Rx / DC Orders   ED Discharge Orders          Ordered    Clobetasol Propionate 0.05 % lotion  2 times daily        10/10/24 1300             Note:  This document was prepared using Dragon voice recognition software and may include unintentional dictation errors.   Herlinda Kirk NOVAK, FNP 10/10/24 1318    Dorothyann Drivers, MD 10/10/24 1436

## 2024-10-10 NOTE — ED Triage Notes (Signed)
 Pt comes with rash noted to face for week. Pt states it stings.

## 2024-10-18 ENCOUNTER — Other Ambulatory Visit: Payer: Self-pay

## 2024-10-18 ENCOUNTER — Emergency Department

## 2024-10-18 ENCOUNTER — Emergency Department: Admission: EM | Admit: 2024-10-18 | Discharge: 2024-10-19 | Disposition: A | Discharge status: Home / Self Care

## 2024-10-18 DIAGNOSIS — R1032 Left lower quadrant pain: Secondary | ICD-10-CM | POA: Diagnosis present

## 2024-10-18 DIAGNOSIS — R197 Diarrhea, unspecified: Secondary | ICD-10-CM | POA: Diagnosis not present

## 2024-10-18 DIAGNOSIS — R109 Unspecified abdominal pain: Secondary | ICD-10-CM

## 2024-10-18 LAB — COMPREHENSIVE METABOLIC PANEL WITH GFR
ALT: 12 U/L (ref 0–44)
AST: 21 U/L (ref 15–41)
Albumin: 4.5 g/dL (ref 3.5–5.0)
Alkaline Phosphatase: 56 U/L (ref 38–126)
Anion gap: 10 (ref 5–15)
BUN: 10 mg/dL (ref 8–23)
CO2: 30 mmol/L (ref 22–32)
Calcium: 9.4 mg/dL (ref 8.9–10.3)
Chloride: 96 mmol/L — ABNORMAL LOW (ref 98–111)
Creatinine, Ser: 0.74 mg/dL (ref 0.61–1.24)
GFR, Estimated: >60 mL/min (ref >60)
Glucose, Bld: 104 mg/dL — ABNORMAL HIGH (ref 70–99)
Potassium: 4.3 mmol/L (ref 3.5–5.1)
Sodium: 136 mmol/L (ref 135–145)
Total Bilirubin: 0.5 mg/dL (ref 0.0–1.2)
Total Protein: 7.1 g/dL (ref 6.5–8.1)

## 2024-10-18 LAB — CBC
HCT: 40.7 % (ref 39.0–52.0)
Hemoglobin: 13.7 g/dL (ref 13.0–17.0)
MCH: 32.8 pg (ref 26.0–34.0)
MCHC: 33.7 g/dL (ref 30.0–36.0)
MCV: 97.4 fL (ref 80.0–100.0)
Platelets: 205 K/uL (ref 150–400)
RBC: 4.18 MIL/uL — ABNORMAL LOW (ref 4.22–5.81)
RDW: 12.8 % (ref 11.5–15.5)
WBC: 5.6 K/uL (ref 4.0–10.5)
nRBC: 0 % (ref 0.0–0.2)

## 2024-10-18 LAB — URINALYSIS, ROUTINE W REFLEX MICROSCOPIC
Bilirubin Urine: NEGATIVE
Glucose, UA: NEGATIVE mg/dL
Hgb urine dipstick: NEGATIVE
Ketones, ur: NEGATIVE mg/dL
Leukocytes,Ua: NEGATIVE
Nitrite: NEGATIVE
Protein, ur: NEGATIVE mg/dL
Specific Gravity, Urine: 1.005 (ref 1.005–1.030)
pH: 7 (ref 5.0–8.0)

## 2024-10-18 LAB — LIPASE, BLOOD: Lipase: 48 U/L (ref 11–51)

## 2024-10-18 MED ORDER — IOHEXOL 300 MG/ML  SOLN
100.0000 mL | Freq: Once | INTRAMUSCULAR | Status: AC | PRN
  Administered 2024-10-18: 100 mL via INTRAVENOUS

## 2024-10-18 MED ORDER — SODIUM CHLORIDE 0.9 % IV BOLUS
1000.0000 mL | Freq: Once | INTRAVENOUS | Status: AC
  Administered 2024-10-18: 1000 mL via INTRAVENOUS

## 2024-10-18 NOTE — ED Provider Notes (Signed)
 [facility] Provider Note    Event Date/Time   First MD Initiated Contact with Patient 10/18/24 2155     (approximate)   History   Abdominal Pain   HPI  Matthew Newman is a 78 y.o. male  with history of GERD, IBS, diverticulitis, prior left-sided abdominal surgery but he cannot tell me of what (presumed hernia surgery) who presents with 4 hours of left lower abdominal pain.  He states that pain began suddenly tonight.  He has not noticed any nausea or vomiting.  Pain is constant and crampy.  He reports some diarrhea and some color change in his stools for the last 2 weeks.  Denies any scrotal or penile abnormalities.  Denies any chest pain or shortness of breath.  He tells me he does have a primary care physician but cannot tell me the name.  He declines pain meds at this time      Physical Exam   Triage Vital Signs: ED Triage Vitals [10/18/24 1950]  Encounter Vitals Group     BP 137/85     Girls Systolic BP Percentile      Girls Diastolic BP Percentile      Boys Systolic BP Percentile      Boys Diastolic BP Percentile      Pulse Rate 71     Resp 17     Temp 98.2 F (36.8 C)     Temp src      SpO2 100 %     Weight      Height      Head Circumference      Peak Flow      Pain Score      Pain Loc      Pain Education      Exclude from Growth Chart     Most recent vital signs: Vitals:   10/18/24 2200 10/18/24 2230  BP: 139/77 133/80  Pulse: (!) 55 (!) 58  Resp: 18 18  Temp:  98.1 F (36.7 C)  SpO2: 100% 100%    Nursing Triage Note reviewed. Vital signs reviewed and patients oxygen saturation is normoxic  General: Patient is well nourished, well developed, awake and alert, resting comfortably in no acute distress Head: Normocephalic and atraumatic Eyes: Normal inspection, extraocular muscles intact, no conjunctival pallor Ear, nose, throat: Normal external exam Neck: Normal range of motion Respiratory: Patient is in no  respiratory distress, lungs CTAB Cardiovascular: Patient is not tachycardic, RRR without murmur appreciated GI: Abd Soft ttp in LLQ  Back: Normal inspection of the back with good strength and range of motion throughout all ext Extremities: pulses intact with good cap refills, no LE pitting edema or calf tenderness Neuro: The patient is alert and oriented to person, place, and time, appropriately conversive, with 5/5 bilat UE/LE strength, no gross motor or sensory defects noted. Coordination appears to be adequate. Skin: Warm, dry, and intact Psych: normal mood and affect, no SI or HI  ED Results / Procedures / Treatments   Labs (all labs ordered are listed, but only abnormal results are displayed) Labs Reviewed  COMPREHENSIVE METABOLIC PANEL WITH GFR - Abnormal; Notable for the following components:      Result Value   Chloride 96 (*)    Glucose, Bld 104 (*)    All other components within normal limits  CBC - Abnormal; Notable for the following components:   RBC 4.18 (*)    All other components within normal limits  URINALYSIS, ROUTINE  W REFLEX MICROSCOPIC - Abnormal; Notable for the following components:   Color, Urine STRAW (*)    APPearance CLEAR (*)    All other components within normal limits  LIPASE, BLOOD     EKG EKG and rhythm strip are interpreted by myself:   EKG: [Normal sinus rhythm] at heart rate of 58, normal QRS duration, QTc 424, normal  ST segments and T waves no ectopy EKG not consistent with Acute STEMI Rhythm strip: NSR in lead II   RADIOLOGY CT abd and pelvis: pending    PROCEDURES:  Critical Care performed: No  Procedures   MEDICATIONS ORDERED IN ED: Medications  sodium chloride  0.9 % bolus 1,000 mL (1,000 mLs Intravenous New Bag/Given 10/18/24 2249)     IMPRESSION / MDM / ASSESSMENT AND PLAN / ED COURSE                                Differential diagnosis includes, but is not limited to: Diverticulitis, UTI, nephrolithiasis,  electrolyte derangement acute renal insufficiency  ED course: Patient presents with several hours of left lower quadrant abdominal pain.  His abdomen on presentation demonstrates no evidence of peritonitis.  An EKG was obtained for consideration of atypical ACS and this was unremarkable.  He has no leukocytosis or electrolyte derangements.  He had no elevation of his liver function tests or lipase.  Urinalysis is pending.  Given his prior history of recurrent diverticulitis and his concern that this is a reoccurrence, CT abdomen pelvis with IV contrast has been ordered and is pending.  Patient signed out to oncoming physician pending this result   Clinical Course as of 10/18/24 2341  Mon Oct 18, 2024  2245 CBC(!) No leukocytosis no acute anemia [HD]  2247 Comprehensive metabolic panel(!) No profound electrolyte derangements [HD]  2334 Urinalysis, Routine w reflex microscopic -Urine, Clean Catch(!) Normal urinalysis [CF]  2341 Patient signed out to oncoming physician at 11:30 PM [HD]    Clinical Course User Index [CF] Gordan Huxley, MD [HD] Nicholaus Rolland BRAVO, MD   -- Risk: 5 This patient has a high risk of morbidity due to further diagnostic testing or treatment. Rationale: This patient's evaluation and management involve a high risk of morbidity due to the potential severity of presenting symptoms, need for diagnostic testing, and/or initiation of treatment that may require close monitoring. The differential includes conditions with potential for significant deterioration or requiring escalation of care. Treatment decisions in the ED, including medication administration, procedural interventions, or disposition planning, reflect this level of risk. COPA: 5 The patient has the following acute or chronic illness/injury that poses a possible threat to life or bodily function: [X] : The patient has a potentially serious acute condition or an acute exacerbation of a chronic illness requiring urgent  evaluation and management in the Emergency Department. The clinical presentation necessitates immediate consideration of life-threatening or function-threatening diagnoses, even if they are ultimately ruled out.   FINAL CLINICAL IMPRESSION(S) / ED DIAGNOSES   Final diagnoses:  Left sided abdominal pain     Rx / DC Orders   ED Discharge Orders     None        Note:  This document was prepared using Dragon voice recognition software and may include unintentional dictation errors.   Nicholaus Rolland BRAVO, MD 10/18/24 [phone]

## 2024-10-18 NOTE — ED Triage Notes (Signed)
 Pt reports lower abd pain that began earlier tonight, pt reports some nausea. Pt denies dysuria.

## 2024-10-18 NOTE — ED Provider Notes (Signed)
-----------------------------------------   11:21 PM on 10/18/2024 -----------------------------------------  Blood pressure 133/80, pulse (!) 58, temperature 98.1 F (36.7 C), temperature source Oral, resp. rate 18, height 1.854 m (6' 1), weight 68.2 kg, SpO2 100%.   Assuming care from Dr. Nicholaus.  In short, Matthew Newman is a 78 y.o. male with a chief complaint of abdominal pain in LLQ.  Refer to the original H&P for additional details.  The current plan of care is to follow up on CT scan.   Clinical Course as of 10/19/24 0233  Mon Oct 18, 2024  2245 CBC(!) No leukocytosis no acute anemia [HD]  2247 Comprehensive metabolic panel(!) No profound electrolyte derangements [HD]  2334 Urinalysis, Routine w reflex microscopic -Urine, Clean Catch(!) Normal urinalysis [CF]  2341 Patient signed out to oncoming physician at 11:30 PM [HD]  Tue Oct 19, 2024  0037 CT ABDOMEN PELVIS W CONTRAST I independently viewed and interpreted the patient's abd/pelvis CT, as well as reviewing the radiologist's report.  I see no evidence of an obstruction or diverticulitis.  Radiology commented on a substantial amount of stool but no other acute or emergent findings [CF]    Clinical Course User Index [CF] Gordan Huxley, MD [HD] Nicholaus Rolland BRAVO, MD     Medications  sodium chloride  0.9 % bolus 1,000 mL (0 mLs Intravenous Stopped 10/19/24 0007)  iohexol  (OMNIPAQUE ) 300 MG/ML solution 100 mL (100 mLs Intravenous Contrast Given 10/18/24 2352)     ED Discharge Orders     None      Final diagnoses:  Left sided abdominal pain     Gordan Huxley, MD 10/19/24 [phone]

## 2024-10-19 NOTE — Discharge Instructions (Signed)

## 2024-12-13 ENCOUNTER — Encounter: Payer: Self-pay | Admitting: Emergency Medicine

## 2024-12-13 ENCOUNTER — Ambulatory Visit
Admission: EM | Admit: 2024-12-13 | Discharge: 2024-12-13 | Disposition: A | Discharge status: Home / Self Care | Source: Home / Self Care

## 2024-12-13 DIAGNOSIS — R1084 Generalized abdominal pain: Secondary | ICD-10-CM

## 2024-12-13 LAB — POCT URINE DIPSTICK
Bilirubin, UA: NEGATIVE
Glucose, UA: NEGATIVE mg/dL
Ketones, POC UA: NEGATIVE mg/dL
Leukocytes, UA: NEGATIVE
Nitrite, UA: NEGATIVE
Protein Ur, POC: NEGATIVE mg/dL
Spec Grav, UA: 1.02
Urobilinogen, UA: 0.2 U/dL
pH, UA: 5

## 2024-12-13 NOTE — ED Triage Notes (Signed)
 Pt c/o lower abdominal pain that started last night. Pt also states his urine is a darker yellow than normal.

## 2024-12-14 LAB — URINE CULTURE: Culture: NO GROWTH
